# Patient Record
Sex: Female | Born: 1965 | Race: Black or African American | Hispanic: No | Marital: Single
Health system: Southern US, Community
[De-identification: ages and names within clinical notes are randomized; demographics above are authoritative.]

## PROBLEM LIST (undated history)

## (undated) DIAGNOSIS — E559 Vitamin D deficiency, unspecified: Secondary | ICD-10-CM

## (undated) DIAGNOSIS — I219 Acute myocardial infarction, unspecified: Secondary | ICD-10-CM

## (undated) DIAGNOSIS — J449 Chronic obstructive pulmonary disease, unspecified: Secondary | ICD-10-CM

## (undated) DIAGNOSIS — I252 Old myocardial infarction: Secondary | ICD-10-CM

## (undated) DIAGNOSIS — I10 Essential (primary) hypertension: Secondary | ICD-10-CM

## (undated) DIAGNOSIS — M170 Bilateral primary osteoarthritis of knee: Secondary | ICD-10-CM

## (undated) DIAGNOSIS — I509 Heart failure, unspecified: Secondary | ICD-10-CM

## (undated) DIAGNOSIS — R7303 Prediabetes: Secondary | ICD-10-CM

## (undated) DIAGNOSIS — G8929 Other chronic pain: Secondary | ICD-10-CM

## (undated) HISTORY — DX: Bilateral primary osteoarthritis of knee: M17.0

## (undated) HISTORY — PX: TUBAL LIGATION: SHX77

## (undated) HISTORY — DX: Old myocardial infarction: I25.2

## (undated) HISTORY — DX: Chronic obstructive pulmonary disease, unspecified: J44.9

## (undated) HISTORY — DX: Heart failure, unspecified: I50.9

## (undated) HISTORY — DX: Essential (primary) hypertension: I10

## (undated) HISTORY — PX: ABDOMINAL HYSTERECTOMY: SHX81

## (undated) HISTORY — DX: Prediabetes: R73.03

## (undated) HISTORY — DX: Acute myocardial infarction, unspecified: I21.9

## (undated) HISTORY — DX: Vitamin D deficiency, unspecified: E55.9

## (undated) HISTORY — DX: Other chronic pain: G89.29

---

## 2014-10-06 ENCOUNTER — Encounter (HOSPITAL_COMMUNITY): Payer: Self-pay | Admitting: Emergency Medicine

## 2014-10-06 ENCOUNTER — Emergency Department (HOSPITAL_COMMUNITY)
Admission: EM | Admit: 2014-10-06 | Discharge: 2014-10-06 | Disposition: A | Discharge status: Home / Self Care | Payer: Self-pay | Attending: Emergency Medicine | Admitting: Emergency Medicine

## 2014-10-06 ENCOUNTER — Emergency Department (HOSPITAL_COMMUNITY): Payer: Self-pay

## 2014-10-06 DIAGNOSIS — Z720 Tobacco use: Secondary | ICD-10-CM | POA: Insufficient documentation

## 2014-10-06 DIAGNOSIS — J209 Acute bronchitis, unspecified: Secondary | ICD-10-CM | POA: Insufficient documentation

## 2014-10-06 DIAGNOSIS — Z79899 Other long term (current) drug therapy: Secondary | ICD-10-CM | POA: Insufficient documentation

## 2014-10-06 LAB — BASIC METABOLIC PANEL
ANION GAP: 9 (ref 5–15)
BUN: 8 mg/dL (ref 6–23)
CALCIUM: 8.9 mg/dL (ref 8.4–10.5)
CO2: 29 mmol/L (ref 19–32)
Chloride: 98 mmol/L (ref 96–112)
Creatinine, Ser: 0.81 mg/dL (ref 0.50–1.10)
GFR calc non Af Amer: 85 mL/min — ABNORMAL LOW (ref >90)
Glucose, Bld: 89 mg/dL (ref 70–99)
POTASSIUM: 3.6 mmol/L (ref 3.5–5.1)
Sodium: 136 mmol/L (ref 135–145)

## 2014-10-06 LAB — CBC
HCT: 44.1 % (ref 36.0–46.0)
Hemoglobin: 15 g/dL (ref 12.0–15.0)
MCH: 32.1 pg (ref 26.0–34.0)
MCHC: 34 g/dL (ref 30.0–36.0)
MCV: 94.4 fL (ref 78.0–100.0)
Platelets: 293 10*3/uL (ref 150–400)
RBC: 4.67 MIL/uL (ref 3.87–5.11)
RDW: 14.7 % (ref 11.5–15.5)
WBC: 5.3 10*3/uL (ref 4.0–10.5)

## 2014-10-06 MED ORDER — DEXAMETHASONE SODIUM PHOSPHATE 10 MG/ML IJ SOLN
6.0000 mg | Freq: Once | INTRAMUSCULAR | Status: AC
  Administered 2014-10-06: 6 mg via INTRAMUSCULAR
  Filled 2014-10-06: qty 1

## 2014-10-06 MED ORDER — ALBUTEROL SULFATE HFA 108 (90 BASE) MCG/ACT IN AERS
2.0000 | INHALATION_SPRAY | Freq: Once | RESPIRATORY_TRACT | Status: AC
  Administered 2014-10-06: 2 via RESPIRATORY_TRACT
  Filled 2014-10-06: qty 6.7

## 2014-10-06 MED ORDER — GUAIFENESIN 100 MG/5ML PO LIQD: 100.0000 mg | ORAL | Status: DC | PRN

## 2014-10-06 MED ORDER — ALBUTEROL SULFATE (2.5 MG/3ML) 0.083% IN NEBU
5.0000 mg | INHALATION_SOLUTION | Freq: Once | RESPIRATORY_TRACT | Status: AC
  Administered 2014-10-06: 5 mg via RESPIRATORY_TRACT
  Filled 2014-10-06: qty 6

## 2014-10-06 NOTE — Discharge Instructions (Signed)

## 2014-10-06 NOTE — ED Notes (Signed)
To ED via [facility] 12-- from work-- with c/o shortness of breath, increasing over past 2 days, wheezing on EMS arrival-- received Albuterol 5mg  nebulizer enroute. Pt in NSR with unifocal PVC's -- IV in left hand-- 20g per EMS

## 2014-10-06 NOTE — ED Provider Notes (Signed)
CSN: [id number]     Arrival date & time 10/06/14  1000 History   First MD Initiated Contact with Patient 10/06/14 1053     Chief Complaint  Patient presents with  . Shortness of Breath   Breanna Khan is a 49 y.o. female who is an everyday smoker who presents to the ED complaining of cold like symptoms for 2 days and increasing shortness of breath since yesterday. Patient reports that beginning 2 days ago she started having cold symptoms with cough, wheezing, and runny nose. She reports starting yesterday she began feeling more short of breath and having more wheezing. She reports today while at work she became increasingly short of breath and having wheezing. Patient called 911 and received albuterol treatment by EMS prior to arrival. She reports feeling improvement after her breathing treatment. Patient denies history of asthma or pneumonia. Patient is an everyday smoker. The patient has taken nothing for treatment today. The patient denies any pain currently. The patient denies fevers, chills, body aches, sick contacts, chest pain, palpitations, abdominal pain, nausea, vomiting, ear pain, eye pain or rashes.  (Consider location/radiation/quality/duration/timing/severity/associated sxs/prior Treatment) HPI  History reviewed. No pertinent past medical history. Past Surgical History  Procedure Laterality Date  . Abdominal hysterectomy    . Tubal ligation     No family history on file. History  Substance Use Topics  . Smoking status: Current Every Day Smoker    Types: Cigarettes  . Smokeless tobacco: Not on file  . Alcohol Use: Yes     Comment: socially   OB History    No data available     Review of Systems  Constitutional: Negative for fever and chills.  HENT: Positive for postnasal drip, rhinorrhea and sneezing. Negative for congestion, ear pain, sinus pressure, sore throat and trouble swallowing.   Eyes: Negative for pain and visual disturbance.  Respiratory: Positive for  cough, shortness of breath and wheezing.   Cardiovascular: Negative for chest pain and palpitations.  Gastrointestinal: Negative for nausea, vomiting, abdominal pain and diarrhea.  Genitourinary: Negative for dysuria, urgency, frequency and hematuria.  Musculoskeletal: Negative for back pain, arthralgias and neck pain.  Skin: Negative for rash and wound.  Neurological: Negative for weakness, light-headedness and headaches.      Allergies  Review of patient's allergies indicates no known allergies.  Home Medications   Prior to Admission medications   Medication Sig Start Date End Date Taking? Authorizing Provider  guaiFENesin (ROBITUSSIN) 100 MG/5ML liquid Take 5-10 mLs (100-200 mg total) by mouth every 4 (four) hours as needed for cough. 10/06/14   Everlene Farrier, PA-C  Pseudoeph-Doxylamine-DM-APAP (NYQUIL PO) Take 1 Dose by mouth as needed (cold symptoms).   Yes Historical Provider, MD  pseudoephedrine (SUDAFED) 30 MG tablet Take 30 mg by mouth every 4 (four) hours as needed for congestion.   Yes Historical Provider, MD   BP 125/76 mmHg  Pulse 88  Temp(Src) 97.9 F (36.6 C) (Oral)  Resp 24  Ht  (1.854 m)  SpO2 92% Physical Exam  Constitutional: She is oriented to person, place, and time. She appears well-developed and well-nourished. No distress.  Nontoxic appearing.  HENT:  Head: Normocephalic and atraumatic.  Right Ear: External ear normal.  Left Ear: External ear normal.  Nose: Nose normal.  Mouth/Throat: Oropharynx is clear and moist. No oropharyngeal exudate.  No oropharyngeal erythema, tonsillar hypertrophy or exudates. Bilateral tympanic membranes are pearly-gray without erythema or loss of landmarks.  Eyes: Conjunctivae are normal. Pupils are equal, round,  and reactive to light. Right eye exhibits no discharge. Left eye exhibits no discharge.  Neck: Normal range of motion. Neck supple. No JVD present. No tracheal deviation present.  Cardiovascular: Normal rate,  regular rhythm, normal heart sounds and intact distal pulses.  Exam reveals no gallop and no friction rub.   No murmur heard. Pulmonary/Chest: Effort normal. No respiratory distress. She has wheezes. She has no rales.  Lung sounds are slightly diminished bilaterally. Scattered wheezes noted bilaterally. Patient speaking in full sentences.  Abdominal: Soft. She exhibits no distension. There is no tenderness.  Musculoskeletal: She exhibits no edema.  No lower extremity edema or tenderness.  Lymphadenopathy:    She has no cervical adenopathy.  Neurological: She is alert and oriented to person, place, and time. Coordination normal.  Skin: Skin is warm and dry. No rash noted. She is not diaphoretic. No erythema. No pallor.  Psychiatric: She has a normal mood and affect. Her behavior is normal.  Nursing note and vitals reviewed.   ED Course  Procedures (including critical care time) Labs Review Labs Reviewed  BASIC METABOLIC PANEL - Abnormal; Notable for the following:    GFR calc non Af Amer 85 (*)    All other components within normal limits  CBC    Imaging Review Dg Chest 2 View (if Patient Has Fever And/or Copd)  10/06/2014   CLINICAL DATA:  Productive cough for the past 2 days. Left chest pain and shortness of breath today.  EXAM: CHEST  2 VIEW  COMPARISON:  None.  FINDINGS: Normal sized heart. Clear lungs. The lungs are mildly hyperexpanded with mild diffuse peribronchial thickening. Unremarkable bones.  IMPRESSION: No acute abnormality.  Mild changes of COPD and chronic bronchitis.   Electronically Signed   By: Beckie Salts M.D.   On: 10/06/2014 12:10     EKG Interpretation   Date/Time:  Sunday October 06 2014 10:11:42 EDT Ventricular Rate:  81 PR Interval:  154 QRS Duration: 85 QT Interval:  400 QTC Calculation: 464 R Axis:   85 Text Interpretation:  Sinus rhythm Multiple ventricular premature  complexes Right atrial enlargement Anterior infarct, old Nonspecific T   abnormalities, inferior leads No previous tracing Confirmed by BEATON  MD,  ROBERT ([id number]) on 10/06/2014 10:55:16 AM      Filed Vitals:   10/06/14 1230 10/06/14 1245 10/06/14 1300 10/06/14 1356  BP: 136/75 143/58 132/66 125/76  Pulse: 80 79 88 88  Temp:      TempSrc:      Resp: 21 17 16 24   Height:      SpO2: 91% 89% 91% 92%     MDM   Meds given in ED:  Medications  albuterol (PROVENTIL) (2.5 MG/3ML) 0.083% nebulizer solution 5 mg (5 mg Nebulization Given 10/06/14 1020)  albuterol (PROVENTIL) (2.5 MG/3ML) 0.083% nebulizer solution 5 mg (5 mg Nebulization Given 10/06/14 1150)  dexamethasone (DECADRON) injection 6 mg (6 mg Intramuscular Given 10/06/14 1503)  albuterol (PROVENTIL HFA;VENTOLIN HFA) 108 (90 BASE) MCG/ACT inhaler 2 puff (2 puffs Inhalation Given 10/06/14 1459)    New Prescriptions   GUAIFENESIN (ROBITUSSIN) 100 MG/5ML LIQUID    Take 5-10 mLs (100-200 mg total) by mouth every 4 (four) hours as needed for cough.    Final diagnoses:  Acute bronchitis, unspecified organism   This  is a 49 y.o. female who is an everyday smoker who presents to the ED complaining of cold like symptoms for 2 days and increasing shortness of breath since yesterday. Patient reports that  beginning 2 days ago she started having cold symptoms with cough, wheezing, and runny nose. She reports starting yesterday she began feeling more short of breath and having more wheezing. She received 5 mg albuterol by EMS prior to arrival. On exam the patient is afebrile and nontoxic appearing. She is not tachypneic or hypoxic. Patient's lungs are diminished bilaterally with scattered wheezes bilaterally. She reports feeling better but still slightly short of breath. Will repeat albuterol treatment and reassess. Chest x-ray and blood work ordered. Chest x-ray shows no acute abnormality but mild changes of COPD and chronic bronchitis. BMP and CBC are unremarkable.  1345: At a revaluation the patient is found  sleeping in her room on room air with an oxygen saturation of 91%. I woke the patient and she reports feeling much better and is no longer short of breath. She denies any chest tightness. Once the patient woke her oxygen saturation is 95% on room air. Will ambulate the patient on pulse ox to determine her oxygen saturation.  The patient ambulated in the hallway without difficulty with the lowest oxygen saturation at 92%. Another reevaluation the patient still reports feeling better and no shortness of breath. She feels ready for discharge. Patient provided with Decadron and albuterol inhaler in the ED.  Advised patient to follow up at the wellness center this week. Strict return precautions provided. I advised the patient to follow-up with their primary care provider this week. I advised the patient to return to the emergency department with new or worsening symptoms or new concerns. The patient verbalized understanding and agreement with plan.   This patient was discussed with Dr. Radford Pax who agrees with assessment and plan.     Everlene Farrier, PA-C 10/06/14 1528  Nelva Nay, MD 10/09/14 1017

## 2014-10-06 NOTE — ED Notes (Signed)
Ambulated in hallway 02 @ 92% HR 96

## 2014-11-27 ENCOUNTER — Other Ambulatory Visit (HOSPITAL_COMMUNITY): Payer: Self-pay

## 2014-11-27 ENCOUNTER — Encounter (HOSPITAL_COMMUNITY)
Admission: EM | Disposition: A | Discharge status: Home / Self Care | Payer: Self-pay | Source: Home / Self Care | Attending: Cardiology

## 2014-11-27 ENCOUNTER — Inpatient Hospital Stay (INDEPENDENT_AMBULATORY_CARE_PROVIDER_SITE_OTHER): Payer: Self-pay

## 2014-11-27 ENCOUNTER — Inpatient Hospital Stay (HOSPITAL_COMMUNITY)
Admission: EM | Admit: 2014-11-27 | Discharge: 2014-11-30 | DRG: 251 | Disposition: A | Discharge status: Home / Self Care | Payer: Self-pay | Attending: Cardiology | Admitting: Cardiology

## 2014-11-27 ENCOUNTER — Emergency Department (HOSPITAL_COMMUNITY): Payer: Self-pay

## 2014-11-27 ENCOUNTER — Encounter (HOSPITAL_COMMUNITY): Payer: Self-pay | Admitting: Emergency Medicine

## 2014-11-27 DIAGNOSIS — Z23 Encounter for immunization: Secondary | ICD-10-CM

## 2014-11-27 DIAGNOSIS — F17210 Nicotine dependence, cigarettes, uncomplicated: Secondary | ICD-10-CM

## 2014-11-27 DIAGNOSIS — I249 Acute ischemic heart disease, unspecified: Secondary | ICD-10-CM

## 2014-11-27 DIAGNOSIS — I200 Unstable angina: Secondary | ICD-10-CM

## 2014-11-27 DIAGNOSIS — I493 Ventricular premature depolarization: Secondary | ICD-10-CM

## 2014-11-27 DIAGNOSIS — I5021 Acute systolic (congestive) heart failure: Secondary | ICD-10-CM

## 2014-11-27 DIAGNOSIS — I219 Acute myocardial infarction, unspecified: Secondary | ICD-10-CM

## 2014-11-27 DIAGNOSIS — I214 Non-ST elevation (NSTEMI) myocardial infarction: Principal | ICD-10-CM | POA: Diagnosis present

## 2014-11-27 DIAGNOSIS — I509 Heart failure, unspecified: Secondary | ICD-10-CM

## 2014-11-27 DIAGNOSIS — I10 Essential (primary) hypertension: Secondary | ICD-10-CM | POA: Diagnosis present

## 2014-11-27 DIAGNOSIS — Z955 Presence of coronary angioplasty implant and graft: Secondary | ICD-10-CM

## 2014-11-27 DIAGNOSIS — Z720 Tobacco use: Secondary | ICD-10-CM

## 2014-11-27 DIAGNOSIS — I25110 Atherosclerotic heart disease of native coronary artery with unstable angina pectoris: Secondary | ICD-10-CM

## 2014-11-27 DIAGNOSIS — E876 Hypokalemia: Secondary | ICD-10-CM | POA: Diagnosis present

## 2014-11-27 DIAGNOSIS — I2510 Atherosclerotic heart disease of native coronary artery without angina pectoris: Secondary | ICD-10-CM

## 2014-11-27 HISTORY — DX: Acute ischemic heart disease, unspecified: I24.9

## 2014-11-27 HISTORY — DX: Heart failure, unspecified: I50.9

## 2014-11-27 HISTORY — DX: Ventricular premature depolarization: I49.3

## 2014-11-27 HISTORY — DX: Acute systolic (congestive) heart failure: I50.21

## 2014-11-27 HISTORY — PX: CARDIAC CATHETERIZATION: SHX172

## 2014-11-27 HISTORY — DX: Acute myocardial infarction, unspecified: I21.9

## 2014-11-27 HISTORY — DX: Nicotine dependence, cigarettes, uncomplicated: F17.210

## 2014-11-27 LAB — CBC
HEMATOCRIT: 46.5 % — AB (ref 36.0–46.0)
Hemoglobin: 16.1 g/dL — ABNORMAL HIGH (ref 12.0–15.0)
MCH: 32.7 pg (ref 26.0–34.0)
MCHC: 34.6 g/dL (ref 30.0–36.0)
MCV: 94.3 fL (ref 78.0–100.0)
Platelets: 310 10*3/uL (ref 150–400)
RBC: 4.93 MIL/uL (ref 3.87–5.11)
RDW: 14 % (ref 11.5–15.5)
WBC: 6.9 10*3/uL (ref 4.0–10.5)

## 2014-11-27 LAB — COMPREHENSIVE METABOLIC PANEL
ALBUMIN: 3.8 g/dL (ref 3.5–5.0)
ALT: 26 U/L (ref 14–54)
AST: 44 U/L — AB (ref 15–41)
Alkaline Phosphatase: 54 U/L (ref 38–126)
Anion gap: 10 (ref 5–15)
BILIRUBIN TOTAL: 0.6 mg/dL (ref 0.3–1.2)
BUN: 10 mg/dL (ref 6–20)
CO2: 25 mmol/L (ref 22–32)
Calcium: 8.8 mg/dL — ABNORMAL LOW (ref 8.9–10.3)
Chloride: 100 mmol/L — ABNORMAL LOW (ref 101–111)
Creatinine, Ser: 1.01 mg/dL — ABNORMAL HIGH (ref 0.44–1.00)
GFR calc Af Amer: >60 mL/min (ref >60)
GFR calc non Af Amer: >60 mL/min (ref >60)
Glucose, Bld: 218 mg/dL — ABNORMAL HIGH (ref 65–99)
Potassium: 3.6 mmol/L (ref 3.5–5.1)
Sodium: 135 mmol/L (ref 135–145)
Total Protein: 7.2 g/dL (ref 6.5–8.1)

## 2014-11-27 LAB — POCT ACTIVATED CLOTTING TIME: ACTIVATED CLOTTING TIME: 454 s

## 2014-11-27 LAB — MRSA PCR SCREENING: MRSA BY PCR: NEGATIVE

## 2014-11-27 LAB — GLUCOSE, CAPILLARY: Glucose-Capillary: 109 mg/dL — ABNORMAL HIGH (ref 65–99)

## 2014-11-27 LAB — BRAIN NATRIURETIC PEPTIDE: B NATRIURETIC PEPTIDE 5: 495 pg/mL — AB (ref 0.0–100.0)

## 2014-11-27 LAB — I-STAT TROPONIN, ED: TROPONIN I, POC: 0.01 ng/mL (ref 0.00–0.08)

## 2014-11-27 LAB — TROPONIN I
TROPONIN I: 0.34 ng/mL — AB (ref <0.031)
TROPONIN I: 0.37 ng/mL — AB (ref <0.031)

## 2014-11-27 LAB — PROTIME-INR
INR: 1.08 (ref 0.00–1.49)
Prothrombin Time: 14.2 seconds (ref 11.6–15.2)

## 2014-11-27 LAB — APTT: aPTT: 29 seconds (ref 24–37)

## 2014-11-27 SURGERY — LEFT HEART CATH AND CORONARY ANGIOGRAPHY
Anesthesia: LOCAL

## 2014-11-27 MED ORDER — LISINOPRIL 5 MG PO TABS
5.0000 mg | ORAL_TABLET | Freq: Every day | ORAL | Status: DC
  Administered 2014-11-27 – 2014-11-30 (×4): 5 mg via ORAL
  Filled 2014-11-27 (×4): qty 1

## 2014-11-27 MED ORDER — AMIODARONE HCL 150 MG/3ML IV SOLN
INTRAVENOUS | Status: AC
  Filled 2014-11-27: qty 3

## 2014-11-27 MED ORDER — OXYCODONE-ACETAMINOPHEN 5-325 MG PO TABS: 1.0000 | ORAL_TABLET | ORAL | Status: DC | PRN

## 2014-11-27 MED ORDER — METOPROLOL TARTRATE 1 MG/ML IV SOLN
INTRAVENOUS | Status: AC
  Filled 2014-11-27: qty 5

## 2014-11-27 MED ORDER — VERAPAMIL HCL 2.5 MG/ML IV SOLN
INTRAVENOUS | Status: AC
  Filled 2014-11-27: qty 2

## 2014-11-27 MED ORDER — MIDAZOLAM HCL 2 MG/2ML IJ SOLN
INTRAMUSCULAR | Status: DC | PRN
  Administered 2014-11-27 (×2): 1 mg via INTRAVENOUS

## 2014-11-27 MED ORDER — SODIUM CHLORIDE 0.9 % IV SOLN
0.2500 mg/kg/h | INTRAVENOUS | Status: DC
  Filled 2014-11-27: qty 250

## 2014-11-27 MED ORDER — BIVALIRUDIN 250 MG IV SOLR
INTRAVENOUS | Status: AC
  Filled 2014-11-27: qty 250

## 2014-11-27 MED ORDER — LIDOCAINE HCL (PF) 1 % IJ SOLN
INTRAMUSCULAR | Status: DC | PRN
  Administered 2014-11-27: 5 mL

## 2014-11-27 MED ORDER — NITROGLYCERIN 1 MG/10 ML FOR IR/CATH LAB
INTRA_ARTERIAL | Status: AC
  Filled 2014-11-27: qty 10

## 2014-11-27 MED ORDER — ASPIRIN 81 MG PO CHEW
81.0000 mg | CHEWABLE_TABLET | Freq: Every day | ORAL | Status: DC
  Administered 2014-11-28 – 2014-11-30 (×3): 81 mg via ORAL
  Filled 2014-11-27 (×3): qty 1

## 2014-11-27 MED ORDER — BIVALIRUDIN BOLUS VIA INFUSION - CUPID
INTRAVENOUS | Status: DC | PRN
  Administered 2014-11-27: 66.375 mg via INTRAVENOUS

## 2014-11-27 MED ORDER — SODIUM CHLORIDE 0.9 % IJ SOLN
INTRAMUSCULAR | Status: DC | PRN
  Administered 2014-11-27: 12:00:00 via INTRA_ARTERIAL

## 2014-11-27 MED ORDER — IOHEXOL 350 MG/ML SOLN
INTRAVENOUS | Status: DC | PRN
  Administered 2014-11-27: 200 mL via INTRACARDIAC

## 2014-11-27 MED ORDER — LIDOCAINE HCL (PF) 1 % IJ SOLN
INTRAMUSCULAR | Status: AC
  Filled 2014-11-27: qty 30

## 2014-11-27 MED ORDER — SODIUM CHLORIDE 0.9 % IJ SOLN
3.0000 mL | Freq: Two times a day (BID) | INTRAMUSCULAR | Status: DC
  Administered 2014-11-27 – 2014-11-28 (×3): 3 mL via INTRAVENOUS
  Administered 2014-11-29: 10 mL via INTRAVENOUS
  Administered 2014-11-29 – 2014-11-30 (×2): 3 mL via INTRAVENOUS

## 2014-11-27 MED ORDER — AMIODARONE HCL 150 MG/3ML IV SOLN
INTRAVENOUS | Status: DC | PRN
  Administered 2014-11-27: 150 mg via INTRAVENOUS

## 2014-11-27 MED ORDER — NITROGLYCERIN IN D5W 200-5 MCG/ML-% IV SOLN
2.0000 ug/min | INTRAVENOUS | Status: AC
  Administered 2014-11-27: 20 ug/min via INTRAVENOUS

## 2014-11-27 MED ORDER — SODIUM CHLORIDE 0.9 % IJ SOLN: 3.0000 mL | INTRAMUSCULAR | Status: DC | PRN

## 2014-11-27 MED ORDER — NITROGLYCERIN IN D5W 200-5 MCG/ML-% IV SOLN
INTRAVENOUS | Status: AC
  Filled 2014-11-27: qty 250

## 2014-11-27 MED ORDER — NITROGLYCERIN IN D5W 200-5 MCG/ML-% IV SOLN
INTRAVENOUS | Status: DC | PRN
  Administered 2014-11-27: 20 ug/min via INTRAVENOUS

## 2014-11-27 MED ORDER — SODIUM CHLORIDE 0.9 % IV SOLN
250.0000 mg | INTRAVENOUS | Status: DC | PRN
  Administered 2014-11-27: 1.75 mg/kg/h via INTRAVENOUS

## 2014-11-27 MED ORDER — CLOPIDOGREL BISULFATE 300 MG PO TABS
ORAL_TABLET | ORAL | Status: DC | PRN
  Administered 2014-11-27: 600 mg via ORAL

## 2014-11-27 MED ORDER — ACETAMINOPHEN 325 MG PO TABS: 650.0000 mg | ORAL_TABLET | ORAL | Status: DC | PRN

## 2014-11-27 MED ORDER — HEPARIN SODIUM (PORCINE) 1000 UNIT/ML IJ SOLN
INTRAMUSCULAR | Status: AC
  Filled 2014-11-27: qty 1

## 2014-11-27 MED ORDER — PNEUMOCOCCAL VAC POLYVALENT 25 MCG/0.5ML IJ INJ
0.5000 mL | INJECTION | INTRAMUSCULAR | Status: AC
  Administered 2014-11-28: 0.5 mL via INTRAMUSCULAR
  Filled 2014-11-27: qty 0.5

## 2014-11-27 MED ORDER — HEPARIN SODIUM (PORCINE) 5000 UNIT/ML IJ SOLN
4000.0000 [IU] | INTRAMUSCULAR | Status: AC
  Administered 2014-11-27: 09:00:00 via INTRAVENOUS
  Filled 2014-11-27: qty 1

## 2014-11-27 MED ORDER — SODIUM CHLORIDE 0.9 % IV SOLN: INTRAVENOUS | Status: DC

## 2014-11-27 MED ORDER — HEPARIN SODIUM (PORCINE) 1000 UNIT/ML IJ SOLN
INTRAMUSCULAR | Status: DC | PRN
  Administered 2014-11-27: 4500 [IU] via INTRAVENOUS

## 2014-11-27 MED ORDER — SODIUM CHLORIDE 0.9 % IV SOLN: 0.2500 mg/kg/h | INTRAVENOUS | Status: DC

## 2014-11-27 MED ORDER — SODIUM CHLORIDE 0.9 % IV SOLN: 0.2500 mg/kg/h | INTRAVENOUS | Status: AC

## 2014-11-27 MED ORDER — FENTANYL CITRATE (PF) 100 MCG/2ML IJ SOLN
INTRAMUSCULAR | Status: DC | PRN
  Administered 2014-11-27: 50 ug via INTRAVENOUS

## 2014-11-27 MED ORDER — CLOPIDOGREL BISULFATE 300 MG PO TABS
ORAL_TABLET | ORAL | Status: AC
  Filled 2014-11-27: qty 1

## 2014-11-27 MED ORDER — ENALAPRILAT 1.25 MG/ML IV SOLN: 1.2500 mg | Freq: Once | INTRAVENOUS | Status: DC

## 2014-11-27 MED ORDER — SODIUM CHLORIDE 0.9 % WEIGHT BASED INFUSION
3.0000 mL/kg/h | INTRAVENOUS | Status: AC
Start: 2014-11-27 — End: 2014-11-27

## 2014-11-27 MED ORDER — SODIUM CHLORIDE 0.9 % IV SOLN: 250.0000 mL | INTRAVENOUS | Status: DC | PRN

## 2014-11-27 MED ORDER — MIDAZOLAM HCL 2 MG/2ML IJ SOLN
INTRAMUSCULAR | Status: AC
  Filled 2014-11-27: qty 2

## 2014-11-27 MED ORDER — SODIUM CHLORIDE 0.9 % IJ SOLN: 3.0000 mL | Freq: Two times a day (BID) | INTRAMUSCULAR | Status: DC

## 2014-11-27 MED ORDER — HEPARIN (PORCINE) IN NACL 2-0.9 UNIT/ML-% IJ SOLN
INTRAMUSCULAR | Status: AC
  Filled 2014-11-27: qty 1000

## 2014-11-27 MED ORDER — CLOPIDOGREL BISULFATE 75 MG PO TABS
75.0000 mg | ORAL_TABLET | Freq: Every day | ORAL | Status: DC
  Administered 2014-11-28 – 2014-11-30 (×3): 75 mg via ORAL
  Filled 2014-11-27 (×3): qty 1

## 2014-11-27 MED ORDER — ONDANSETRON HCL 4 MG/2ML IJ SOLN: 4.0000 mg | Freq: Four times a day (QID) | INTRAMUSCULAR | Status: DC | PRN

## 2014-11-27 MED ORDER — METOPROLOL TARTRATE 1 MG/ML IV SOLN
5.0000 mg | Freq: Once | INTRAVENOUS | Status: AC
  Administered 2014-11-27: 5 mg via INTRAVENOUS

## 2014-11-27 MED ORDER — FENTANYL CITRATE (PF) 100 MCG/2ML IJ SOLN
INTRAMUSCULAR | Status: AC
  Filled 2014-11-27: qty 2

## 2014-11-27 MED ORDER — NITROGLYCERIN 1 MG/10 ML FOR IR/CATH LAB
INTRA_ARTERIAL | Status: DC | PRN
  Administered 2014-11-27: 200 ug via INTRACORONARY

## 2014-11-27 MED ORDER — ASPIRIN 81 MG PO CHEW: 81.0000 mg | CHEWABLE_TABLET | ORAL | Status: DC

## 2014-11-27 MED ORDER — ATORVASTATIN CALCIUM 80 MG PO TABS
80.0000 mg | ORAL_TABLET | Freq: Every day | ORAL | Status: DC
  Administered 2014-11-27 – 2014-11-29 (×3): 80 mg via ORAL
  Filled 2014-11-27 (×4): qty 1

## 2014-11-27 MED ORDER — CARVEDILOL 3.125 MG PO TABS
3.1250 mg | ORAL_TABLET | Freq: Two times a day (BID) | ORAL | Status: DC
  Administered 2014-11-27 – 2014-11-28 (×2): 3.125 mg via ORAL
  Filled 2014-11-27 (×4): qty 1

## 2014-11-27 SURGICAL SUPPLY — 18 items
BALLN ANGIOSCULPT RX 3.0X10 (BALLOONS) ×2
BALLN EUPHORA RX 2.5X12 (BALLOONS) ×2
BALLOON ANGIOSCULPT RX 3.0X10 (BALLOONS) ×1 IMPLANT
BALLOON EUPHORA RX 2.5X12 (BALLOONS) ×1 IMPLANT
CATH INFINITI 5 FR JL3.5 (CATHETERS) ×2 IMPLANT
CATH INFINITI 5FR ANG PIGTAIL (CATHETERS) ×2 IMPLANT
CATH INFINITI JR4 5F (CATHETERS) ×2 IMPLANT
CATH VISTA GUIDE 6FR XB3.5 (CATHETERS) ×2 IMPLANT
DEVICE RAD COMP TR BAND LRG (VASCULAR PRODUCTS) ×2 IMPLANT
GLIDESHEATH SLEND A-KIT 6F 22G (SHEATH) ×2 IMPLANT
KIT ENCORE 26 ADVANTAGE (KITS) ×2 IMPLANT
KIT HEART LEFT (KITS) ×2 IMPLANT
PACK CARDIAC CATHETERIZATION (CUSTOM PROCEDURE TRAY) ×2 IMPLANT
TRANSDUCER W/STOPCOCK (MISCELLANEOUS) ×2 IMPLANT
TUBING CIL FLEX 10 FLL-RA (TUBING) ×2 IMPLANT
WIRE ASAHI PROWATER 180CM (WIRE) ×2 IMPLANT
WIRE HI TORQ BMW 190CM (WIRE) ×2 IMPLANT
WIRE SAFE-T 1.5MM-J .035X260CM (WIRE) ×2 IMPLANT

## 2014-11-27 NOTE — H&P (Signed)
CARDIOLOGY CONSULTATION NOTE.  NAME:  Breanna Khan   MRN: [id number] DOB:  [birth year]   ADMIT DATE: 11/27/2014  Reason for Consult: Acute coronary syndrome  Requesting Physician: Dr. Fayrene Fearing - EDP  Primary Cardiologist: New  HPI:  This is a 49 y.o. female with a past medical history significant for 20-pack-year smoking history but otherwise no recorded history of hypertension, hyperlipidemia or diabetes. She is not aware of any family history of any cardiac risk factors were CAD. She was in her usual state of health until roughly 3 days ago when she started having intermittent episodes of 5-10 minutes of central substernal chest pressure with exertion that over the last 3 days as happened with less exertion.  Today while walking across street to the bus stop to get to work, she had severe 78 out of 10 substernal crushing chest pain with some nausea and diaphoresis that resolved after roughly 10 minutes. 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 She was profoundly diaphoretic and nauseated and dizzy. She felt as though she may pass out. She felt rapid palpitations. Upon EMS arrival she was found to be profoundly hypertensive with blood pressures of 220/110 with frequent PVCs and what appeared to be lateral ST depressions.  Telemetry strips from EMS showed very frequent PVCs in mostly bigeminy pattern but also with couplets and triplets of PVCs. She was given sublingual nitroglycerin by EMS 2 along with IV morphine. 324 mg oral aspirin was administered. Upon arrival to [facility] ER she was relatively pain-free and blood pressures were down to the 150s over 100s. She was still having bigeminy PVCs. The normal QRS complexes in the lateral cortical leads show likely LVH with flat ST segments (as seen by EKG from April 2016) however the ST segments were depressed at least 1-2 mm in  leads V4 through V6.  On my evaluation in the emergency room the patient appears calm and is deathly pain-free no longer having dyspnea or nausea. Antecedent Cardiovascular ROS prior to last 3 days: no chest pain or dyspnea on exertion negative for - edema, irregular heartbeat, loss of consciousness, murmur, orthopnea, palpitations, paroxysmal nocturnal dyspnea, rapid heart rate, shortness of breath or TIA/amaurosis fugax, syncope/near-syncope   PMHx:  CARDIAC HISTORY: None Prior EKG from April 2016: Sinus rhythm, heart rate 81 with frequent PVCs. Poor R-wave progression precordial leads (read as anterior infarct, old) with likely LVH in lateral leads with flat ST segments.  History reviewed. No pertinent past medical history. Past Surgical History  Procedure Laterality Date  . Abdominal hysterectomy    . Tubal ligation      FAMHx: History reviewed. No pertinent family history. the patient is not aware of any family history of diabetes, hypertension, hyperlipidemia or coronary disease.  SOCHx:  reports that she has been smoking Cigarettes.  She has a 10 pack-year smoking history. She does not have any smokeless tobacco history on file. She reports that she drinks alcohol. She reports that she does not use illicit drugs.  ALLERGIES: No Known Allergies  HOME MEDICATIONS: Not on any standing meds.  HOSPITAL MEDICATIONS: - Given 4000 Units IV Heparin EMS - NTG SL x 2, IV Morphine ~2mg , ASA 324 mg  Review of Systems  Constitutional: Negative for fever, chills and malaise/fatigue.  HENT: Negative for nosebleeds.   Eyes: Negative for blurred vision.  Respiratory: Negative for shortness of  breath.   Cardiovascular: Negative for palpitations.  Gastrointestinal: Negative for blood in stool and melena.  Neurological: Negative for dizziness and headaches.  All other systems reviewed and are negative.   VITALS: Blood pressure 143/103, temperature 97.4 F (36.3 C), temperature source  Oral, resp. rate 18, height  (1.88 m), weight 88.451 kg (195 lb), SpO2 100 %.  PHYSICAL EXAM: General appearance: alert, cooperative, appears stated age, mild distress and Very anxious. Otherwise healthy appearing HEENT: [place]/AT, EOMI, MMM, anicteric sclera Neck: no adenopathy, no carotid bruit, no JVD and supple, symmetrical, trachea midline Lungs: clear to auscultation bilaterally, normal percussion bilaterally and Nonlabored, good air movement Heart: RRR with frequent ectopy, no obvious M/R/G. Non-displaced PMI Abdomen: soft, non-tender; bowel sounds normal; no masses,  no organomegaly Extremities: extremities normal, atraumatic, no cyanosis or edema Pulses: 2+ and symmetric Skin: Skin color, texture, turgor normal. No rashes or lesions Neurologic: Mental status: Alert, oriented, thought content appropriate, affect: normal Cranial nerves: normal  LABS: Results for orders placed or performed during the hospital encounter of 11/27/14 (from the past 24 hour(s))  APTT     Status: None   Collection Time: 11/27/14  9:14 AM  Result Value Ref Range   aPTT 29 24 - 37 seconds  CBC     Status: Abnormal   Collection Time: 11/27/14  9:14 AM  Result Value Ref Range   WBC 6.9 4.0 - 10.5 K/uL   RBC 4.93 3.87 - 5.11 MIL/uL   Hemoglobin 16.1 (H) 12.0 - 15.0 g/dL   HCT 16.1 (H) 09.6 - 04.5 %   MCV 94.3 78.0 - 100.0 fL   MCH 32.7 26.0 - 34.0 pg   MCHC 34.6 30.0 - 36.0 g/dL   RDW 40.9 81.1 - 91.4 %   Platelets 310 150 - 400 K/uL  Comprehensive metabolic panel     Status: Abnormal   Collection Time: 11/27/14  9:14 AM  Result Value Ref Range   Sodium 135 135 - 145 mmol/L   Potassium 3.6 3.5 - 5.1 mmol/L   Chloride 100 (L) 101 - 111 mmol/L   CO2 25 22 - 32 mmol/L   Glucose, Bld 218 (H) 65 - 99 mg/dL   BUN 10 6 - 20 mg/dL   Creatinine, Ser 7.82 (H) 0.44 - 1.00 mg/dL   Calcium 8.8 (L) 8.9 - 10.3 mg/dL   Total Protein 7.2 6.5 - 8.1 g/dL   Albumin 3.8 3.5 - 5.0 g/dL   AST 44 (H) 15 - 41 U/L     ALT 26 14 - 54 U/L   Alkaline Phosphatase 54 38 - 126 U/L   Total Bilirubin 0.6 0.3 - 1.2 mg/dL   GFR calc non Af Amer >60 >60 mL/min   GFR calc Af Amer >60 >60 mL/min   Anion gap 10 5 - 15  Protime-INR     Status: None   Collection Time: 11/27/14  9:14 AM  Result Value Ref Range   Prothrombin Time 14.2 11.6 - 15.2 seconds   INR 1.08 0.00 - 1.49  Brain natriuretic peptide     Status: Abnormal   Collection Time: 11/27/14  9:14 AM  Result Value Ref Range   B Natriuretic Peptide 495.0 (H) 0.0 - 100.0 pg/mL  I-Stat Troponin, ED (not at [facility], [facility])     Status: None   Collection Time: 11/27/14  9:26 AM  Result Value Ref Range   Troponin i, poc 0.01 0.00 - 0.08 ng/mL   Comment 3  IMAGING: Dg Chest Port 1 View  11/27/2014   CLINICAL DATA:  Intermittent chest pain for 3 days. Diaphoresis and shortness of breath.  EXAM: PORTABLE CHEST - 1 VIEW  COMPARISON:  10/06/2014  FINDINGS: Cardiac silhouette appears mildly enlarged, accentuated by portable AP technique and shallower inspiration than on the prior study. Mild peribronchial thickening is again seen. Patchy opacities in the left greater than right lung bases are new. No overt pulmonary edema, definite pleural effusion, or pneumothorax is identified. No acute osseous abnormality is identified.  IMPRESSION: Shallower inspiration with patchy bibasilar opacities, likely atelectasis.   Electronically Signed   By: Sebastian Ache   On: 11/27/2014 09:44    EKG: NSR - 99, PVCs in Vent Bigeminy patter, BiAtrial Abnormality, Poor anterior wave progression - Inferolateral ST depression  IMPRESSION: Principal Problem:   Acute coronary syndrome Active Problems:   Frequent PVCs - with pairs   Accelerated essential hypertension   Smokes 1/2 pack per day  49 year old woman with no real prior cardiac history presenting with signs and symptoms concerning for unstable angina/acute coronary syndrome with grossly abnormal EKG and frequent PVCs with  increasing pattern of bigeminy and pairs. This is definitely concerning for high risk acute coronary syndrome.  RECOMMENDATION:  Patient is still hypertensive and somewhat tachycardic with frequent PVCs in the ER, we will dose with IV Lopressor 5 mg  Plan for urgent cardiac catheterization plus minus PCI today.  She is currently nothing by mouth. Cath Lab as been contacted and she should be taken directly to cardiac catheterization lab.  I have discussed the patient with Dr. Katrinka Blazing, who will be performing the procedure.  Performing MD:  Verdis Prime, M.D Procedure:  Left Heart Catheterization with Coronary Angiography And Possible Percutaneous Coronary Intervention  The procedure with Risks/Benefits/Alternatives and Indications was reviewed with the patient and her fianc.  All questions were answered.    Risks / Complications include, but not limited to: Death, MI, CVA/TIA, VF/VT (with defibrillation), Bradycardia (need for temporary pacer placement), contrast induced nephropathy, bleeding / bruising / hematoma / pseudoaneurysm, vascular or coronary injury (with possible emergent CT or Vascular Surgery), adverse medication reactions, infection.  Additional risks involving the use of radiation with the possibility of radiation burns and cancer were explained in detail.  The patient (and fianc) voice understanding and agree to proceed.     She will need aggressive risk factor modification and evaluation pending results of cardiac catheterization.  Will start statin and beta blocker  Smoking cessation counseling    Time Spent Directly with Patient: 60 minutes  HARDING, Piedad Climes, M.D., M.S. Interventional Cardiologist   Pager # [phone]

## 2014-11-27 NOTE — Interval H&P Note (Signed)
Cath Lab Visit (complete for each Cath Lab visit)  Clinical Evaluation Leading to the Procedure:   ACS: Yes.    Non-ACS:    Anginal Classification: CCS IV  Anti-ischemic medical therapy: Maximal Therapy (2 or more classes of medications)  Non-Invasive Test Results: No non-invasive testing performed  Prior CABG: No previous CABG      History and Physical Interval Note:  11/27/2014 11:43 AM  Breanna Khan  has presented today for surgery, with the diagnosis of cp, EKG changes  The various methods of treatment have been discussed with the patient and family. After consideration of risks, benefits and other options for treatment, the patient has consented to  Procedure(s): Left Heart Cath and Coronary Angiography (N/A) as a surgical intervention .  The patient's history has been reviewed, patient examined, no change in status, stable for surgery.  I have reviewed the patient's chart and labs.  Questions were answered to the patient's satisfaction.     Lesleigh Noe

## 2014-11-27 NOTE — ED Notes (Signed)
Pt having intermittent CP past 3 days. Pt at work today having crushing chest pain. Pt diaphoretic, SOB. EMS gave 1 nitro, 324mg  ASA, 4 mg Morphine. Pain down to 5/10. Pt having depression and elevation in EKG. Possible stemi. BP 220/130. Pt now denies CP

## 2014-11-27 NOTE — ED Provider Notes (Signed)
CSN: [id number]     Arrival date & time 11/27/14  0908 History   First MD Initiated Contact with Patient 11/27/14 [phone]     Chief Complaint  Patient presents with  . Chest Pain      HPI  She presents for evaluation of chest pain. No history of known cardiac disease. Intimately for the last 3 days she's had pressure-like pain in her central chest mostly with exertion in less than half an hour at a time relieved with rest. No past similar episodes. She awakened this morning. She had walked to a bus stop to come to work. She had pain while ambulating better resolved sitting on the bus. 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 States it was severe and felt "crushing". She stopped and sat. EMS was summoned. Had PVCs and ST depressions. Given nitroglycerin 2 and IV morphine as well as 324 aspirin. Arrives here pain-free.  History of hypertension diabetes or any known family history of heart disease. She is a daily smoker.  History reviewed. No pertinent past medical history. Past Surgical History  Procedure Laterality Date  . Abdominal hysterectomy    . Tubal ligation     History reviewed. No pertinent family history. History  Substance Use Topics  . Smoking status: Current Every Day Smoker    Types: Cigarettes  . Smokeless tobacco: Not on file  . Alcohol Use: Yes     Comment: socially   OB History    No data available     Review of Systems  Constitutional: Positive for diaphoresis. Negative for fever, chills, appetite change and fatigue.  HENT: Negative for mouth sores, sore throat and trouble swallowing.   Eyes: Negative for visual disturbance.  Respiratory: Positive for shortness of breath. Negative for cough, chest tightness and wheezing.   Cardiovascular: Positive for chest pain.  Gastrointestinal: Negative for nausea, vomiting, abdominal pain, diarrhea and abdominal distention.  Endocrine: Negative for  polydipsia, polyphagia and polyuria.  Genitourinary: Negative for dysuria, frequency and hematuria.  Musculoskeletal: Negative for gait problem.  Skin: Negative for color change, pallor and rash.  Neurological: Negative for dizziness, syncope, light-headedness and headaches.  Hematological: Does not bruise/bleed easily.  Psychiatric/Behavioral: Negative for behavioral problems and confusion.      Allergies  Review of patient's allergies indicates no known allergies.  Home Medications   Prior to Admission medications   Medication Sig Start Date End Date Taking? Authorizing Provider  guaiFENesin (ROBITUSSIN) 100 MG/5ML liquid Take 5-10 mLs (100-200 mg total) by mouth every 4 (four) hours as needed for cough. 10/06/14   Everlene Farrier, PA-C  Pseudoeph-Doxylamine-DM-APAP (NYQUIL PO) Take 1 Dose by mouth as needed (cold symptoms).    Historical Provider, MD  pseudoephedrine (SUDAFED) 30 MG tablet Take 30 mg by mouth every 4 (four) hours as needed for congestion.    Historical Provider, MD   BP 153/104 mmHg  Temp(Src) 97.4 F (36.3 C) (Oral)  Resp 18  Ht 6\' 2"  (1.88 m)  Wt 195 lb (88.451 kg)  BMI 25.03 kg/m2  SpO2 98% Physical Exam  Constitutional: She is oriented to person, place, and time. She appears well-developed and well-nourished. No distress.  HENT:  Head: Normocephalic.  Eyes: Conjunctivae are normal. Pupils are equal, round, and reactive to light. No scleral icterus.  Neck: Normal range of motion. Neck supple. No thyromegaly present.  Cardiovascular: Normal rate and regular rhythm.  Exam reveals no  gallop and no friction rub.   No murmur heard. Pulmonary/Chest: Effort normal and breath sounds normal. No respiratory distress. She has no wheezes. She has no rales.  Abdominal: Soft. Bowel sounds are normal. She exhibits no distension. There is no tenderness. There is no rebound.  Musculoskeletal: Normal range of motion.  Neurological: She is alert and oriented to person,  place, and time.  Skin: Skin is warm and dry. No rash noted.  Psychiatric: She has a normal mood and affect. Her behavior is normal.  Appears appropriately anxious.    ED Course  Procedures (including critical care time) Labs Review Labs Reviewed  CBC - Abnormal; Notable for the following:    Hemoglobin 16.1 (*)    HCT 46.5 (*)    All other components within normal limits  APTT  PROTIME-INR  COMPREHENSIVE METABOLIC PANEL  BRAIN NATRIURETIC PEPTIDE  I-STAT TROPOININ, ED    Imaging Review Dg Chest Port 1 View  11/27/2014   CLINICAL DATA:  Intermittent chest pain for 3 days. Diaphoresis and shortness of breath.  EXAM: PORTABLE CHEST - 1 VIEW  COMPARISON:  10/06/2014  FINDINGS: Cardiac silhouette appears mildly enlarged, accentuated by portable AP technique and shallower inspiration than on the prior study. Mild peribronchial thickening is again seen. Patchy opacities in the left greater than right lung bases are new. No overt pulmonary edema, definite pleural effusion, or pneumothorax is identified. No acute osseous abnormality is identified.  IMPRESSION: Shallower inspiration with patchy bibasilar opacities, likely atelectasis.   Electronically Signed   By: Sebastian Ache   On: 11/27/2014 09:44     EKG Interpretation   Date/Time:  Wednesday November 27 2014 09:20:20 EDT Ventricular Rate:  114 PR Interval:  154 QRS Duration: 91 QT Interval:  358 QTC Calculation: 493 R Axis:   27 Text Interpretation:  Sinus tachycardia Ventricular bigeminy Inferior and  lateral ST depressin and T inversions LVH with secondary repolarization  abnormality Anterior infarct, old Confirmed by Fayrene Fearing  MD, Madelon Welsch ([id number]) on  11/27/2014 10:20:18 AM      MDM   Final diagnoses:  Unstable angina    CRITICAL CARE Performed by: Rolland Porter JOSEPH   Total critical care time:30 minutes.  Critical care time was exclusive of separately billable procedures and treating other patients.  Critical care was  necessary to treat or prevent imminent or life-threatening deterioration.  Critical care was time spent personally by me on the following activities: development of treatment plan with patient and/or surrogate as well as nursing, discussions with consultants, evaluation of patient's response to treatment, examination of patient, obtaining history from patient or surrogate, ordering and performing treatments and interventions, ordering and review of laboratory studies, ordering and review of radiographic studies, pulse oximetry and re-evaluation of patient's condition. Care   She presents pain free after being given nitroglycerin and morphine prehospital. Remains pain-free. Given heparin bolus and drip. Had been given aspirin prior to her arrival. EKG shows lateral and inferior ST depressions. Call placed to cardiology. Will be seen in consultation.    Rolland Porter, MD 11/27/14 1023

## 2014-11-27 NOTE — Progress Notes (Signed)
Echocardiogram 2D Echocardiogram has been performed.  Nolon Rod 11/27/2014, 5:09 PM

## 2014-11-28 ENCOUNTER — Encounter (HOSPITAL_COMMUNITY): Payer: Self-pay | Admitting: Interventional Cardiology

## 2014-11-28 DIAGNOSIS — I214 Non-ST elevation (NSTEMI) myocardial infarction: Principal | ICD-10-CM

## 2014-11-28 LAB — BASIC METABOLIC PANEL
ANION GAP: 12 (ref 5–15)
BUN: 8 mg/dL (ref 6–20)
CALCIUM: 7.9 mg/dL — AB (ref 8.9–10.3)
CO2: 21 mmol/L — AB (ref 22–32)
Chloride: 102 mmol/L (ref 101–111)
Creatinine, Ser: 0.59 mg/dL (ref 0.44–1.00)
GFR calc Af Amer: >60 mL/min (ref >60)
Glucose, Bld: 87 mg/dL (ref 65–99)
Potassium: 3.1 mmol/L — ABNORMAL LOW (ref 3.5–5.1)
SODIUM: 135 mmol/L (ref 135–145)

## 2014-11-28 LAB — CBC
HCT: 41.9 % (ref 36.0–46.0)
Hemoglobin: 14.5 g/dL (ref 12.0–15.0)
MCH: 32.4 pg (ref 26.0–34.0)
MCHC: 34.6 g/dL (ref 30.0–36.0)
MCV: 93.7 fL (ref 78.0–100.0)
Platelets: 268 10*3/uL (ref 150–400)
RBC: 4.47 MIL/uL (ref 3.87–5.11)
RDW: 14 % (ref 11.5–15.5)
WBC: 7.7 10*3/uL (ref 4.0–10.5)

## 2014-11-28 LAB — TROPONIN I: TROPONIN I: 0.35 ng/mL — AB (ref <0.031)

## 2014-11-28 MED ORDER — CARVEDILOL 6.25 MG PO TABS
6.2500 mg | ORAL_TABLET | Freq: Two times a day (BID) | ORAL | Status: DC
  Administered 2014-11-28 – 2014-11-29 (×2): 6.25 mg via ORAL
  Filled 2014-11-28 (×4): qty 1

## 2014-11-28 MED ORDER — CARVEDILOL 3.125 MG PO TABS
3.1250 mg | ORAL_TABLET | Freq: Once | ORAL | Status: AC
  Administered 2014-11-28: 3.125 mg via ORAL
  Filled 2014-11-28: qty 1

## 2014-11-28 MED FILL — Heparin Sodium (Porcine) 2 Unit/ML in Sodium Chloride 0.9%: INTRAMUSCULAR | Qty: 1000 | Status: AC

## 2014-11-28 NOTE — Progress Notes (Signed)
 Subjective:  No recurrent chest pain  Objective:   Vital Signs : Filed Vitals:   11/28/14 0700 11/28/14 0735 11/28/14 0800 11/28/14 0900  BP: 154/95 154/95 173/108 124/76  Pulse: 67 69 65   Temp:  97.8 F (36.6 C)    TempSrc:  Oral    Resp: 15 17 15 18  Height:      Weight:      SpO2: 99% 100% 100%     Intake/Output from previous day:  Intake/Output Summary (Last 24 hours) at 11/28/14 1000 Last data filed at 11/28/14 0900  Gross per 24 hour  Intake 993.38 ml  Output   1450 ml  Net -456.62 ml    I/O since admission: -456.6  Wt Readings from Last 3 Encounters:  11/27/14 198 lb 3.1 oz (89.9 kg)    Medications: . aspirin  81 mg Oral Daily  . atorvastatin  80 mg Oral q1800  . carvedilol  3.125 mg Oral BID WC  . clopidogrel  75 mg Oral Q breakfast  . lisinopril  5 mg Oral Daily  . pneumococcal 23 valent vaccine  0.5 mL Intramuscular Tomorrow-1000  . sodium chloride  3 mL Intravenous Q12H    . nitroGLYCERIN 10 mcg/min (11/27/14 1845)    Physical Exam:   General appearance: alert, cooperative and no distress Neck: no adenopathy, no carotid bruit, no JVD, supple, symmetrical, trachea midline and thyroid not enlarged, symmetric, no tenderness/mass/nodules Lungs: slightly decreased BS; no wheezing or rales Heart: regular rate and rhythm and 1/6 sem LSB Abdomen: soft, non-tender; bowel sounds normal; no masses,  no organomegaly Extremities: extremities normal, atraumatic, no cyanosis or edema and no edema, redness or tenderness in the calves or thighs Pulses: 2+ and symmetric; radial cath site stable Skin: Skin color, texture, turgor normal. No rashes or lesions Neurologic: Grossly normal   Rate: 85  Rhythm: normal sinus rhythm; PVC  ECG (independently read by me): NSR with PVC and inferior T wave inversion.  Lab Results:   Recent Labs  11/27/14 0914 11/28/14 0026  NA 135 135  K 3.6 3.1*  CL 100* 102  CO2 25 21*  GLUCOSE 218* 87  BUN 10 8    CREATININE 1.01* 0.59  CALCIUM 8.8* 7.9*    Hepatic Function Latest Ref Rng 11/27/2014  Total Protein 6.5 - 8.1 g/dL 7.2  Albumin 3.5 - 5.0 g/dL 3.8  AST 15 - 41 U/L 44(H)  ALT 14 - 54 U/L 26  Alk Phosphatase 38 - 126 U/L 54  Total Bilirubin 0.3 - 1.2 mg/dL 0.6     Recent Labs  11/27/14 0914 11/28/14 0026  WBC 6.9 7.7  HGB 16.1* 14.5  HCT 46.5* 41.9  MCV 94.3 93.7  PLT 310 268     Recent Labs  11/27/14 1707 11/27/14 1829 11/28/14 0026  TROPONINI 0.34* 0.37* 0.35*    No results found for: TSH No results for input(s): HGBA1C in the last 72 hours.   Recent Labs  11/27/14 0914  PROT 7.2  ALBUMIN 3.8  AST 44*  ALT 26  ALKPHOS 54  BILITOT 0.6    Recent Labs  11/27/14 0914  INR 1.08   BNP (last 3 results)  Recent Labs  11/27/14 0914  BNP 495.0*    ProBNP (last 3 results) No results for input(s): PROBNP in the last 8760 hours.   Lipid Panel  No results found for: CHOL, TRIG, HDL, CHOLHDL, VLDL, LDLCALC, LDLDIRECT     Imaging:  Dg Chest Port 1   View  11/27/2014   CLINICAL DATA:  Intermittent chest pain for 3 days. Diaphoresis and shortness of breath.  EXAM: PORTABLE CHEST - 1 VIEW  COMPARISON:  10/06/2014  FINDINGS: Cardiac silhouette appears mildly enlarged, accentuated by portable AP technique and shallower inspiration than on the prior study. Mild peribronchial thickening is again seen. Patchy opacities in the left greater than right lung bases are new. No overt pulmonary edema, definite pleural effusion, or pneumothorax is identified. No acute osseous abnormality is identified.  IMPRESSION: Shallower inspiration with patchy bibasilar opacities, likely atelectasis.   Electronically Signed   By: Allen  Grady   On: 11/27/2014 09:44      Assessment/Plan:   Principal Problem:   Acute coronary syndrome Active Problems:   Smokes 1/2 pack per day   Frequent PVCs - with pairs   Accelerated essential hypertension   ACS (acute coronary  syndrome)   Acute systolic HF (heart failure)  1. NSTEMI due to 99% LCX/OM stenosis treated with Angiosculpt scoring balloon 2. Occasional PVC's 3. H/O tobacco: pt vows to quit 4. HTN 5. Hypo K: replete  Will further titrate carvedilol to 6.25 mg bid today. Check Lipid panel. Discussed smoking cessation. F/U ECG. Will transfer to telemetry; home in 24 - 48 hrs if stable.   A. , MD, FACC 11/28/2014, 10:00 AM 

## 2014-11-28 NOTE — Progress Notes (Signed)
CARDIAC REHAB PHASE I   PRE:  Rate/Rhythm: 75 SR with PVC    BP: sitting 132/80    SaO2: 100 RA  MODE:  Ambulation: 900 ft   POST:  Rate/Rhythm: 94 SR with PVC    BP: sitting 138/88     SaO2: 100 RA  Tolerated very well, feels good. Ed completed with great reception. Pt very motivated to quit smoking and be healthier. Interested in [facility] and will send referral to [facility]. Gave pt financial aid application. Pt needs $4 prescriptions. [phone]   Elissa Lovett [place] CES, ACSM 11/28/2014 3:25 PM

## 2014-11-28 NOTE — Care Management Note (Addendum)
Case Management Note  Patient Details  Name: Breanna Khan MRN: [id number] Date of Birth: 05/13/1966  Subjective/Objective:     Pt does not have PCP and requests information re [facility].  Has orange card, states she definitely wants to quit smoking and take medications as prescribed.  Provided brochure for [facility], pt plans to call for appointment.   Breanna Khan Black RN [facility]   11/28/14            Expected Discharge Date:                  Expected Discharge Plan:  Home/Self Care  In-House Referral:     Discharge planning Services  CM Consult, Medication Assistance  Post Acute Care Choice:    Choice offered to:     DME Arranged:    DME Agency:     HH Arranged:    HH Agency:     Status of Service:  In process, will continue to follow  Medicare Important Message Given:    Date Medicare IM Given:    Medicare IM give by:    Date Additional Medicare IM Given:    Additional Medicare Important Message give by:     If discussed at Long Length of Stay Meetings, dates discussed:    Additional Comments:

## 2014-11-28 NOTE — Progress Notes (Signed)
Utilization review completed.  

## 2014-11-29 DIAGNOSIS — I5021 Acute systolic (congestive) heart failure: Secondary | ICD-10-CM

## 2014-11-29 LAB — LIPID PANEL
Cholesterol: 130 mg/dL (ref 0–200)
HDL: 39 mg/dL — ABNORMAL LOW (ref >40)
LDL CALC: 79 mg/dL (ref 0–99)
TRIGLYCERIDES: 62 mg/dL (ref <150)
Total CHOL/HDL Ratio: 3.3 RATIO
VLDL: 12 mg/dL (ref 0–40)

## 2014-11-29 LAB — MAGNESIUM: MAGNESIUM: 1.6 mg/dL — AB (ref 1.7–2.4)

## 2014-11-29 LAB — BASIC METABOLIC PANEL
Anion gap: 8 (ref 5–15)
BUN: 5 mg/dL — ABNORMAL LOW (ref 6–20)
CO2: 27 mmol/L (ref 22–32)
Calcium: 8.3 mg/dL — ABNORMAL LOW (ref 8.9–10.3)
Chloride: 102 mmol/L (ref 101–111)
Creatinine, Ser: 0.62 mg/dL (ref 0.44–1.00)
GFR calc Af Amer: >60 mL/min (ref >60)
GFR calc non Af Amer: >60 mL/min (ref >60)
GLUCOSE: 87 mg/dL (ref 65–99)
POTASSIUM: 3.5 mmol/L (ref 3.5–5.1)
Sodium: 137 mmol/L (ref 135–145)

## 2014-11-29 LAB — CBC
HCT: 46.6 % — ABNORMAL HIGH (ref 36.0–46.0)
Hemoglobin: 16.3 g/dL — ABNORMAL HIGH (ref 12.0–15.0)
MCH: 33.1 pg (ref 26.0–34.0)
MCHC: 35 g/dL (ref 30.0–36.0)
MCV: 94.7 fL (ref 78.0–100.0)
PLATELETS: 273 10*3/uL (ref 150–400)
RBC: 4.92 MIL/uL (ref 3.87–5.11)
RDW: 14 % (ref 11.5–15.5)
WBC: 6.8 10*3/uL (ref 4.0–10.5)

## 2014-11-29 LAB — TSH: TSH: 1.115 u[IU]/mL (ref 0.350–4.500)

## 2014-11-29 LAB — T4, FREE: Free T4: 0.8 ng/dL (ref 0.61–1.12)

## 2014-11-29 MED ORDER — POTASSIUM CHLORIDE CRYS ER 20 MEQ PO TBCR: 20.0000 meq | EXTENDED_RELEASE_TABLET | Freq: Every day | ORAL | Status: DC

## 2014-11-29 MED ORDER — CARVEDILOL 12.5 MG PO TABS
12.5000 mg | ORAL_TABLET | Freq: Two times a day (BID) | ORAL | Status: DC
  Administered 2014-11-29 – 2014-11-30 (×2): 12.5 mg via ORAL
  Filled 2014-11-29 (×4): qty 1

## 2014-11-29 MED ORDER — POTASSIUM CHLORIDE CRYS ER 20 MEQ PO TBCR
40.0000 meq | EXTENDED_RELEASE_TABLET | Freq: Once | ORAL | Status: AC
  Administered 2014-11-29: 40 meq via ORAL
  Filled 2014-11-29: qty 2

## 2014-11-29 MED ORDER — MAGNESIUM OXIDE 400 (241.3 MG) MG PO TABS
400.0000 mg | ORAL_TABLET | Freq: Two times a day (BID) | ORAL | Status: DC
  Administered 2014-11-29 – 2014-11-30 (×3): 400 mg via ORAL
  Filled 2014-11-29 (×4): qty 1

## 2014-11-29 MED ORDER — POTASSIUM CHLORIDE CRYS ER 20 MEQ PO TBCR
40.0000 meq | EXTENDED_RELEASE_TABLET | Freq: Every day | ORAL | Status: DC
  Administered 2014-11-29 – 2014-11-30 (×2): 40 meq via ORAL
  Filled 2014-11-29 (×2): qty 2

## 2014-11-29 NOTE — Progress Notes (Addendum)
Patient: Breanna Khan / Admit Date: 11/27/2014 / Date of Encounter: 11/29/2014, 11:27 AM   Subjective: No CP, SOB, or awareness of ectopy.    Objective: Telemetry: NSR with frequent PVCs, sometimes couplets, rare bigeminy Physical Exam: Blood pressure 115/68, pulse 67, temperature 98.2 F (36.8 C), temperature source Oral, resp. rate 18, height  (1.88 m), weight 198 lb 3.1 oz (89.9 kg), SpO2 100 %. General: Well developed, well nourished F, in no acute distress. Head: Normocephalic, atraumatic, sclera non-icteric, no xanthomas, nares are without discharge. Neck: JVP not elevated. Lungs: Clear bilaterally to auscultation without wheezes, rales, or rhonchi. Breathing is unlabored. Heart: RRR but occasional ectopy, S1 S2 without murmurs, rubs, or gallops.  Abdomen: Soft, non-tender, non-distended with normoactive bowel sounds. No rebound/guarding. Extremities: No clubbing or cyanosis. No edema. Distal pedal pulses are 2+ and equal bilaterally. Right wrist cath site without hematoma, ecchymosis. Good pulse. Neuro: Alert and oriented X 3. Moves all extremities spontaneously. Psych:  Responds to questions appropriately with a normal affect.   Intake/Output Summary (Last 24 hours) at 11/29/14 1127 Last data filed at 11/29/14 0800  Gross per 24 hour  Intake    240 ml  Output      0 ml  Net    240 ml    Inpatient Medications:  . aspirin  81 mg Oral Daily  . atorvastatin  80 mg Oral q1800  . carvedilol  6.25 mg Oral BID WC  . clopidogrel  75 mg Oral Q breakfast  . lisinopril  5 mg Oral Daily  . potassium chloride  40 mEq Oral Daily  . sodium chloride  3 mL Intravenous Q12H   Infusions:    Labs:  Recent Labs  11/28/14 0026 11/29/14 0319  NA 135 137  K 3.1* 3.5  CL 102 102  CO2 21* 27  GLUCOSE 87 87  BUN 8 5*  CREATININE 0.59 0.62  CALCIUM 7.9* 8.3*  MG  --  1.6*    Recent Labs  11/27/14 0914  AST 44*  ALT 26  ALKPHOS 54  BILITOT 0.6  PROT 7.2  ALBUMIN 3.8     Recent Labs  11/28/14 0026 11/29/14 0319  WBC 7.7 6.8  HGB 14.5 16.3*  HCT 41.9 46.6*  MCV 93.7 94.7  PLT 268 273    Recent Labs  11/27/14 1707 11/27/14 1829 11/28/14 0026  TROPONINI 0.34* 0.37* 0.35*   Invalid input(s): POCBNP No results for input(s): HGBA1C in the last 72 hours.   Radiology/Studies:  Dg Chest Port 1 View  11/27/2014   CLINICAL DATA:  Intermittent chest pain for 3 days. Diaphoresis and shortness of breath.  EXAM: PORTABLE CHEST - 1 VIEW  COMPARISON:  10/06/2014  FINDINGS: Cardiac silhouette appears mildly enlarged, accentuated by portable AP technique and shallower inspiration than on the prior study. Mild peribronchial thickening is again seen. Patchy opacities in the left greater than right lung bases are new. No overt pulmonary edema, definite pleural effusion, or pneumothorax is identified. No acute osseous abnormality is identified.  IMPRESSION: Shallower inspiration with patchy bibasilar opacities, likely atelectasis.   Electronically Signed   By: Sebastian Ache   On: 11/27/2014 09:44     Assessment and Plan  1. NSTEMI/CAD 2. Frequent PVCs 3. Tobacco abuse, counseled 4. Accelerated HTN, improved 5. Hypokalemia 6. 2D Echo findings of EF 55-60%, grade 1 DD, severe LAE, mild RAE, mod TR, dilated IVC >3 that does not collapse  Doing well from ACS standpoint. Continue ASA, BB, statin,  Plavix.  The biggest issue at present time is the frequency of PVCs which sounds like what was noted on admission. K was 3.1 yesterday, up to 3.5 today without intervention. Will start KCl daily. 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 Will check Mg level and thyroid function. BP is 115/68 before any medications this AM so it's not clear that we have room to titrate BB further.  If PVCs do not quiet down with improved potassium level, consider 48-hour holter to quantify burden. Await MD decision regarding timing of  DC.  Signed, Ronie Spies PA-C Pager: [phone]   Patient seen and examined. Agree with assessment and plan. Asymptomatic PVC.  Mg 1.6; K 3.5. Will give Mg Oxide 400 mg bid for several doses; KCL 40 meq now.  Will titrate bb and f/u BP. Pt prefers to stay today.  Will continue to observe rhythm, and plan dc tomorrow.   Lennette Bihari, MD, [facility] 11/29/2014 11:27 AM

## 2014-11-29 NOTE — Clinical Documentation Improvement (Addendum)
"  Acute Systolic Heart Failure" is documented in the dictated cath lab report dated 11/27/14.  Unable to locate any documentation that the patient has received IV Lasix this admission.  EF by LVG less than 25% with an LV EDP of 22 mmHg.  EF by Echo is 55-60% with grade 1 diastolic dysfunction, severely enlarged left atrium, moderate TR, PA peak systolic pressure 41 mm hg, and elevated central venous pressure.  Please clarify if the patient has had "Acute Systolic Heart Failure" this admission or not.   Thank You, Jerral Ralph ,RN Clinical Documentation Specialist:  [phone] [facility]- Health Information Management

## 2014-11-29 NOTE — Progress Notes (Signed)
CARDIAC REHAB PHASE I   PRE:  Rate/Rhythm: 72 SR with occ PVC    BP:     SaO2:   MODE:  Ambulation: 850 ft   POST:  Rate/Rhythm: 81 SR with occ PVC    BP: sitting right 140/90, left 150/80     SaO2:   Pt feels well, no c/o. Monitored rhythm entire walk. Has periods of no PVCS, ~30-40 secs, then resumes 1 PVC every 7 bts or so.  I did not take BP before walk as pt was up moving quickly but after walk BP was elevated (see above). Also see my note from yesterday. Pt without questions about d/c.  [id number]  Elissa Lovett [place] CES, ACSM 11/29/2014 10:45 AM

## 2014-11-29 NOTE — Hospital Discharge Follow-Up (Signed)
Received communication from Marvetta Gibbons, RN CM that patient has no insurance and needs a PCP.  Met with patient and spoke with patient about [place]. Patient indicates she would like to establish care at [facility].  Appointment obtained for patient on 12/04/14 at 1000 with Dr. Jarold Song. Patient appreciative of appointment. Marvetta Gibbons, RN CM updated.

## 2014-11-30 LAB — MAGNESIUM: Magnesium: 1.7 mg/dL (ref 1.7–2.4)

## 2014-11-30 LAB — BASIC METABOLIC PANEL
Anion gap: 6 (ref 5–15)
BUN: 10 mg/dL (ref 6–20)
CHLORIDE: 103 mmol/L (ref 101–111)
CO2: 27 mmol/L (ref 22–32)
CREATININE: 0.68 mg/dL (ref 0.44–1.00)
Calcium: 8.5 mg/dL — ABNORMAL LOW (ref 8.9–10.3)
GFR calc Af Amer: >60 mL/min (ref >60)
GFR calc non Af Amer: >60 mL/min (ref >60)
GLUCOSE: 87 mg/dL (ref 65–99)
POTASSIUM: 4 mmol/L (ref 3.5–5.1)
Sodium: 136 mmol/L (ref 135–145)

## 2014-11-30 MED ORDER — ATORVASTATIN CALCIUM 80 MG PO TABS: 80.0000 mg | ORAL_TABLET | Freq: Every day | ORAL | Status: DC

## 2014-11-30 MED ORDER — POTASSIUM CHLORIDE CRYS ER 20 MEQ PO TBCR: 40.0000 meq | EXTENDED_RELEASE_TABLET | Freq: Every day | ORAL | Status: DC

## 2014-11-30 MED ORDER — ASPIRIN 81 MG PO CHEW: 81.0000 mg | CHEWABLE_TABLET | Freq: Every day | ORAL | Status: DC

## 2014-11-30 MED ORDER — MAGNESIUM OXIDE 400 (241.3 MG) MG PO TABS: 400.0000 mg | ORAL_TABLET | Freq: Two times a day (BID) | ORAL | Status: DC

## 2014-11-30 MED ORDER — LISINOPRIL 5 MG PO TABS: 5.0000 mg | ORAL_TABLET | Freq: Every day | ORAL | Status: DC

## 2014-11-30 MED ORDER — CARVEDILOL 12.5 MG PO TABS: 12.5000 mg | ORAL_TABLET | Freq: Two times a day (BID) | ORAL | Status: DC

## 2014-11-30 MED ORDER — CLOPIDOGREL BISULFATE 75 MG PO TABS: 75.0000 mg | ORAL_TABLET | Freq: Every day | ORAL | Status: DC

## 2014-11-30 NOTE — Progress Notes (Deleted)
Physician Discharge Summary  Patient ID: Breanna Khan MRN: [id number] DOB/AGE: Aug 02, 1965 49 y.o.   Primary Cardiologist: Dr. Herbie Baltimore  Admit date: 11/27/2014 Discharge date: 11/30/2014  Admission Diagnoses: ACS/ NSTEMI  Discharge Diagnoses:  Principal Problem:   Acute coronary syndrome Active Problems:   Smokes 1/2 pack per day   Frequent PVCs - with pairs   Accelerated essential hypertension   ACS (acute coronary syndrome)   Acute systolic HF (heart failure)   Discharged Condition: stable  Hospital Course: 49 y.o. female with a past medical history significant for 20-pack-year smoking history but otherwise no recorded history of hypertension, hyperlipidemia or diabetes who presented to [facility] on 11/27/14 via EMS with ACS. Upon EMS arrival she was found to be profoundly hypertensive with blood pressures of 220/110 with frequent PVCs and what appeared to be lateral ST depressions. Telemetry strips from EMS showed very frequent PVCs in mostly bigeminy pattern but also with couplets and triplets of PVCs. She was given sublingual nitroglycerin by EMS 2 along with IV morphine. 324 mg oral aspirin was administered. Upon arrival to [facility] ER she was relatively pain-free and blood pressures were down to the 150s over 100s. She was still having bigeminy PVCs. The normal QRS complexes in the lateral cortical leads show likely LVH with flat ST segments (as seen by EKG from April 2016) however the ST segments were depressed at least 1-2 mm in leads V4 through V6. Her presentation was concerning for high risk acute coronary syndrome. She was taken urgently to the cath lab. The procedure was performed by Dr. Katrinka Blazing. She was found to have high-grade obstruction of the ostium of a large branching first obtuse marginal. She underwent successful scoring balloon angioplasty on a 95-99% ostial first obtuse marginal with reduction in stenosis to less than 20% with TIMI grade 3 flow. All other vessels were  patent. She was also noted to have severe LVF at time of cath with EF at 15-20%. She tolerated the procedure well and left the cath lab in stable condition. She had no further CP. She was placed on DAPT with ASA + Plavix. She was also started on Coreg, lisinopril and atorvastatin. A post PCI echo was performed which showed significant improvement in LV systolic function with EF at 55-60%. During recovery, she continued to have frequent PVCs. TSH was normal. She did have mild hypokalemia and hypomagnesimia and required supplementation. Her BB was also increased. The frequency of her PVCs resolved with supplemenation of electrolytes and BB therapy. She had no other issues. She was last seen and examined by Dr. Ladona Ridgel who determined she was stable for discharge home. TOC post hospital f/u will be arranged in our office.     Consults: None  Significant Diagnostic Studies:  LHC 11/27/14  Conclusion    1. Ost 1st Mrg lesion, 99% stenosed. There is a 20% residual stenosis post intervention.   Acute coronary syndrome presentation due to high-grade obstruction of the ostium of a large branching first obtuse marginal.  Severe systolic heart failure, possibly acute, etiology uncertain. EF 15-20%.  Successful scoring balloon angioplasty on a 95-99% ostial first obtuse marginal with reduction in stenosis to less than 20% with TIMI grade 3 flow  Otherwise widely patent coronary arteries     2D Echo 11/27/14 Study Conclusions  - Left ventricle: The cavity size was normal. Wall thickness was normal. Systolic function was normal. The estimated ejection fraction was in the range of 55% to 60%. Wall motion was normal; there were  no regional wall motion abnormalities. Doppler parameters are consistent with abnormal left ventricular relaxation (grade 1 diastolic dysfunction). The E/e&' ratio is between 8-15, suggesting indeterminate LV filling pressure. - Left atrium: Severely dilated at 48  ml/m2. - Right atrium: Mildly dilated at 21 cm2. - Tricuspid valve: There was moderate regurgitation. - Pulmonary arteries: PA peak pressure: 41 mm Hg (S). - Inferior vena cava: The vessel was dilated. The respirophasic diameter changes were blunted (< 50%), consistent with elevated central venous pressure.  Impressions:  - LVEF 55-60%, normal wall thickness, normal wall motion, diastolic dysfunction, indeterminate LV filling pressure, severe LAE, mild RAE, moderate TR, RVSP 41 mmHg, dilated IVC to >3 cm that does not collapse.   Treatments: See Hospital Course  Discharge Exam: Blood pressure 137/69, pulse 72, temperature 97.8 F (36.6 C), temperature source Oral, resp. rate 18, height  (1.88 m), weight 198 lb 3.1 oz (89.9 kg), SpO2 99 %.   Disposition: 01-Home or Self Care      Discharge Instructions    Amb Referral to Cardiac Rehabilitation    Complete by:  As directed   Congestive Heart Failure: If diagnosis is Heart Failure, patient MUST meet each of the CMS criteria: 1. Left Ventricular Ejection Fraction </= 35% 2. NYHA class II-IV symptoms despite being on optimal heart failure therapy for at least 6 weeks. 3. Stable = have not had a recent (<6 weeks) or planned (<6 months) major cardiovascular hospitalization or procedure  Program Details: - Physician supervised classes - 1-3 classes per week over a 12-18 week period, generally for a total of 36 sessions  Physician Certification: I certify that the above Cardiac Rehabilitation treatment is medically necessary and is medically approved by me for treatment of this patient. The patient is willing and cooperative, able to ambulate and medically stable to participate in exercise rehabilitation. The participant's progress and Individualized Treatment Plan will be reviewed by the Medical Director, Cardiac Rehab staff and as indicated by the Referring/Ordering Physician.  Diagnosis:   PCI Myocardial Infarction         Diet - low sodium heart healthy    Complete by:  As directed      Increase activity slowly    Complete by:  As directed             Medication List    STOP taking these medications        guaiFENesin 100 MG/5ML liquid  Commonly known as:  ROBITUSSIN     ibuprofen 200 MG tablet  Commonly known as:  ADVIL,MOTRIN     pseudoephedrine 30 MG tablet  Commonly known as:  SUDAFED      TAKE these medications        aspirin 81 MG chewable tablet  Chew 1 tablet (81 mg total) by mouth daily.     atorvastatin 80 MG tablet  Commonly known as:  LIPITOR  Take 1 tablet (80 mg total) by mouth daily at 6 PM.     carvedilol 12.5 MG tablet  Commonly known as:  COREG  Take 1 tablet (12.5 mg total) by mouth 2 (two) times daily with a meal.     clopidogrel 75 MG tablet  Commonly known as:  PLAVIX  Take 1 tablet (75 mg total) by mouth daily with breakfast.     lisinopril 5 MG tablet  Commonly known as:  PRINIVIL,ZESTRIL  Take 1 tablet (5 mg total) by mouth daily.     magnesium oxide 400 (241.3 MG) MG  tablet  Commonly known as:  MAG-OX  Take 1 tablet (400 mg total) by mouth 2 (two) times daily.     potassium chloride SA 20 MEQ tablet  Commonly known as:  K-DUR,KLOR-CON  Take 2 tablets (40 mEq total) by mouth daily.       Follow-up Information    Follow up with [facility]     On 12/04/2014.   Why:  Appointment on 12/04/14 at 10:00am with Dr. Carleene Overlie information:   [address] [place] [address] [phone]      Follow up with Marykay Lex, MD.   Specialty:  Cardiology   Why:  our office will call you with a follow-up appointment   Contact information:   [address] [place] [address] [phone]      TIME SPENT ON DISCHARGE, INCLUDING PHYSICIAN TIME: >30 MINUTES  Signed: Robbie Lis 11/30/2014, 11:51 AM

## 2014-11-30 NOTE — Progress Notes (Signed)
Patient ID: Breanna Khan, female   DOB: May 12, 1966, 49 y.o.   MRN: [id number]    Patient Name: Breanna Khan Date of Encounter: 11/30/2014     Principal Problem:   Acute coronary syndrome Active Problems:   Smokes 1/2 pack per day   Frequent PVCs - with pairs   Accelerated essential hypertension   ACS (acute coronary syndrome)   Acute systolic HF (heart failure)    SUBJECTIVE  No chest pain or sob. "I have stopped smoking." Anxious about going home.  CURRENT MEDS . aspirin  81 mg Oral Daily  . atorvastatin  80 mg Oral q1800  . carvedilol  12.5 mg Oral BID WC  . clopidogrel  75 mg Oral Q breakfast  . lisinopril  5 mg Oral Daily  . magnesium oxide  400 mg Oral BID  . potassium chloride  40 mEq Oral Daily  . sodium chloride  3 mL Intravenous Q12H    OBJECTIVE  Filed Vitals:   11/29/14 1513 11/29/14 2058 11/30/14 0547 11/30/14 0940  BP: 145/67 123/79 140/81 137/69  Pulse: 56 58 72   Temp:  97.5 F (36.4 C) 97.8 F (36.6 C)   TempSrc:  Oral Oral   Resp: 18  18   Height:      Weight:      SpO2: 99% 100% 99%     Intake/Output Summary (Last 24 hours) at 11/30/14 1124 Last data filed at 11/29/14 1645  Gross per 24 hour  Intake    480 ml  Output      0 ml  Net    480 ml   Filed Weights   11/27/14 0913 11/27/14 1510  Weight: 195 lb (88.451 kg) 198 lb 3.1 oz (89.9 kg)    PHYSICAL EXAM  General: Pleasant, NAD. Neuro: Alert and oriented X 3. Moves all extremities spontaneously. Psych: Normal affect. HEENT:  Normal  Neck: Supple without bruits or JVD. Lungs:  Resp regular and unlabored, CTA. Heart: RRR no s3, s4, or murmurs. Abdomen: Soft, non-tender, non-distended, BS + x 4.  Extremities: No clubbing, cyanosis or edema. DP/PT/Radials 2+ and equal bilaterally.  Accessory Clinical Findings  CBC  Recent Labs  11/28/14 0026 11/29/14 0319  WBC 7.7 6.8  HGB 14.5 16.3*  HCT 41.9 46.6*  MCV 93.7 94.7  PLT 268 273   Basic Metabolic Panel  Recent  Labs  21/30/86 0319 11/30/14 0342  NA 137 136  K 3.5 4.0  CL 102 103  CO2 27 27  GLUCOSE 87 87  BUN 5* 10  CREATININE 0.62 0.68  CALCIUM 8.3* 8.5*  MG 1.6* 1.7   Liver Function Tests No results for input(s): AST, ALT, ALKPHOS, BILITOT, PROT, ALBUMIN in the last 72 hours. No results for input(s): LIPASE, AMYLASE in the last 72 hours. Cardiac Enzymes  Recent Labs  11/27/14 1707 11/27/14 1829 11/28/14 0026  TROPONINI 0.34* 0.37* 0.35*   BNP Invalid input(s): POCBNP D-Dimer No results for input(s): DDIMER in the last 72 hours. Hemoglobin A1C No results for input(s): HGBA1C in the last 72 hours. Fasting Lipid Panel  Recent Labs  11/29/14 0319  CHOL 130  HDL 39*  LDLCALC 79  TRIG 62  CHOLHDL 3.3   Thyroid Function Tests  Recent Labs  11/29/14 1126  TSH 1.115    TELE  nsr with pvc's.  Radiology/Studies  Dg Chest Port 1 View  11/27/2014   CLINICAL DATA:  Intermittent chest pain for 3 days. Diaphoresis and shortness of breath.  EXAM: PORTABLE  CHEST - 1 VIEW  COMPARISON:  10/06/2014  FINDINGS: Cardiac silhouette appears mildly enlarged, accentuated by portable AP technique and shallower inspiration than on the prior study. Mild peribronchial thickening is again seen. Patchy opacities in the left greater than right lung bases are new. No overt pulmonary edema, definite pleural effusion, or pneumothorax is identified. No acute osseous abnormality is identified.  IMPRESSION: Shallower inspiration with patchy bibasilar opacities, likely atelectasis.   Electronically Signed   By: Sebastian Ache   On: 11/27/2014 09:44    ASSESSMENT AND PLAN  1. NSTEMI - no anginal symptoms.  2.HTN - blood pressure minimall elevated. Should get better with uptitration beta blocker over time. 3. PVC's -continue beta blocker 4. Tobacco abuse - encouraged to stop smoking Rec: ok for discharge. Continue current meds. She may return to work 12/20/2014.   Luvenia Cranford,M.D.  11/30/2014 11:24  AM

## 2014-11-30 NOTE — Progress Notes (Signed)
Patient needs help with discharge medications.Case management notified.

## 2014-11-30 NOTE — Care Management Note (Signed)
Case Management Note  Patient Details  Name: Breanna Khan MRN: [id number] Date of Birth: July 03, 1965  Subjective/Objective:                  episodes of 5-10 minutes of central substernal chest pressure with exertion  Action/Plan:  Discharge planning Expected Discharge Date:  11/30/14               Expected Discharge Plan:  Home/Self Care  In-House Referral:     Discharge planning Services  CM Consult, Medication Assistance, [facility]  Post Acute Care Choice:    Choice offered to:     DME Arranged:    DME Agency:     HH Arranged:    HH Agency:     Status of Service:  Completed, signed off  Medicare Important Message Given:    Date Medicare IM Given:    Medicare IM give by:    Date Additional Medicare IM Given:    Additional Medicare Important Message give by:     If discussed at [place] of Stay Meetings, dates discussed:    Additional Comments: CM met with pt and gave pt a MATCH letter with list of participating pharmacies.  Pt verbalized understanding of all MATCH parameters.  Previous CM Steffanie Dunn) has set up an appt with Dr. Jarold Song 12/04/14 at 10:00 for follow up care.  Pt verbalizes understanding she is to go to the clinic at this time for follow up; insurance; and future medication asst.  No other CM needs were communicated. Dellie Catholic, RN 11/30/2014, 2:48 PM

## 2014-11-30 NOTE — Progress Notes (Signed)
Discharge instructions given to patient and verbalized understanding. 

## 2014-11-30 NOTE — Progress Notes (Signed)
CARDIAC REHAB PHASE I   PRE:  Rate/Rhythm: 54 sinus brady  BP:  Supine:   Sitting: 118/66  Standing:     MODE:  Ambulation: 890 ft   POST:  Rate/Rhythem: 69  BP:  Supine:   Sitting: 142/72  Standing:   Pt ambulated 890 ft with assist x1. Tolerated walk well without complaint.  Reviewed financial assistance process for rehab with pt.  Pt does want to make sure that she gets $4 Rx in order for her to be able to afford them.  Encouraged pt to talk to MD and nurse before going home to just double check on this.  Pt voiced understanding.  Pt returned to bedside after walk with call bell and phone in reach. Fabio Pierce, MA, ACSM RCEP [phone]  Hazle Nordmann

## 2014-12-01 NOTE — Discharge Summary (Addendum)
Physician Discharge Summary  Patient ID: Breanna Khan MRN: [id number] DOB/AGE: 49-27-1967 49 y.o.   Primary Cardiologist: Dr. Herbie Baltimore  Admit date: 11/27/2014 Discharge date: 11/30/2014  Admission Diagnoses: ACS/ NSTEMI  Discharge Diagnoses:  Principal Problem:   Acute coronary syndrome Active Problems:   Smokes 1/2 pack per day   Frequent PVCs - with pairs   Accelerated essential hypertension   ACS (acute coronary syndrome)   Acute LV dysfunction which resolved after PCI   Discharged Condition: stable  Hospital Course: 49 y.o. female with a past medical history significant for 20-pack-year smoking history but otherwise no recorded history of hypertension, hyperlipidemia or diabetes who presented to [facility] on 11/27/14 via EMS with ACS. Upon EMS arrival she was found to be profoundly hypertensive with blood pressures of 220/110 with frequent PVCs and what appeared to be lateral ST depressions. Telemetry strips from EMS showed very frequent PVCs in mostly bigeminy pattern but also with couplets and triplets of PVCs. She was given sublingual nitroglycerin by EMS 2 along with IV morphine. 324 mg oral aspirin was administered. Upon arrival to [facility] ER she was relatively pain-free and blood pressures were down to the 150s over 100s. She was still having bigeminy PVCs. The normal QRS complexes in the lateral cortical leads show likely LVH with flat ST segments (as seen by EKG from April 2016) however the ST segments were depressed at least 1-2 mm in leads V4 through V6. Her presentation was concerning for high risk acute coronary syndrome. She was taken urgently to the cath lab. The procedure was performed by Dr. Katrinka Blazing. She was found to have high-grade obstruction of the ostium of a large branching first obtuse marginal. She underwent successful scoring balloon angioplasty on a 95-99% ostial first obtuse marginal with reduction in stenosis to less than 20% with TIMI grade 3 flow. All other  vessels were patent. She was also noted to have severe LV dysfunction at time of cath with EF at 15-20%. She tolerated the procedure well and left the cath lab in stable condition. She had no further CP. She was placed on DAPT with ASA + Plavix. She was also started on Coreg, lisinopril and atorvastatin. A post PCI echo was performed which showed significant improvement in LV systolic function with EF at 55-60%. During recovery, she continued to have frequent PVCs. TSH was normal. She did have mild hypokalemia and hypomagnesimia and required supplementation. Her BB was also increased. The frequency of her PVCs resolved with supplemenation of electrolytes and BB therapy. She had no other issues. She was last seen and examined by Dr. Ladona Ridgel who determined she was stable for discharge home. TOC post hospital f/u will be arranged in our office.     Consults: None  Significant Diagnostic Studies:  LHC 11/27/14  Conclusion    1. Ost 1st Mrg lesion, 99% stenosed. There is a 20% residual stenosis post intervention.   Acute coronary syndrome presentation due to high-grade obstruction of the ostium of a large branching first obtuse marginal.  Severe LV dysfunction. EF 15-20%.  Successful scoring balloon angioplasty on a 95-99% ostial first obtuse marginal with reduction in stenosis to less than 20% with TIMI grade 3 flow  Otherwise widely patent coronary arteries     2D Echo 11/27/14 Study Conclusions  - Left ventricle: The cavity size was normal. Wall thickness was normal. Systolic function was normal. The estimated ejection fraction was in the range of 55% to 60%. Wall motion was normal; there were no regional  wall motion abnormalities. Doppler parameters are consistent with abnormal left ventricular relaxation (grade 1 diastolic dysfunction). The E/e&' ratio is between 8-15, suggesting indeterminate LV filling pressure. - Left atrium: Severely dilated at 48 ml/m2. - Right  atrium: Mildly dilated at 21 cm2. - Tricuspid valve: There was moderate regurgitation. - Pulmonary arteries: PA peak pressure: 41 mm Hg (S). - Inferior vena cava: The vessel was dilated. The respirophasic diameter changes were blunted (< 50%), consistent with elevated central venous pressure.  Impressions:  - LVEF 55-60%, normal wall thickness, normal wall motion, diastolic dysfunction, indeterminate LV filling pressure, severe LAE, mild RAE, moderate TR, RVSP 41 mmHg, dilated IVC to >3 cm that does not collapse.   Treatments: See Hospital Course  Discharge Exam: Blood pressure 137/69, pulse 72, temperature 97.8 F (36.6 C), temperature source Oral, resp. rate 18, height 6\' 2"  (1.88 m), weight 198 lb 3.1 oz (89.9 kg), SpO2 99 %.   Disposition: 01-Home or Self Care      Discharge Instructions    Amb Referral to Cardiac Rehabilitation    Complete by:  As directed   Acute coronary syndrome:   Diagnosis:   PCI Myocardial Infarction       Diet - low sodium heart healthy    Complete by:  As directed      Increase activity slowly    Complete by:  As directed             Medication List    STOP taking these medications        guaiFENesin 100 MG/5ML liquid  Commonly known as:  ROBITUSSIN     ibuprofen 200 MG tablet  Commonly known as:  ADVIL,MOTRIN     pseudoephedrine 30 MG tablet  Commonly known as:  SUDAFED      TAKE these medications        aspirin 81 MG chewable tablet  Chew 1 tablet (81 mg total) by mouth daily.     atorvastatin 80 MG tablet  Commonly known as:  LIPITOR  Take 1 tablet (80 mg total) by mouth daily at 6 PM.     carvedilol 12.5 MG tablet  Commonly known as:  COREG  Take 1 tablet (12.5 mg total) by mouth 2 (two) times daily with a meal.     clopidogrel 75 MG tablet  Commonly known as:  PLAVIX  Take 1 tablet (75 mg total) by mouth daily with breakfast.     lisinopril 5 MG tablet  Commonly known as:  PRINIVIL,ZESTRIL  Take 1  tablet (5 mg total) by mouth daily.     magnesium oxide 400 (241.3 MG) MG tablet  Commonly known as:  MAG-OX  Take 1 tablet (400 mg total) by mouth 2 (two) times daily.     potassium chloride SA 20 MEQ tablet  Commonly known as:  K-DUR,KLOR-CON  Take 2 tablets (40 mEq total) by mouth daily.       Follow-up Information    Follow up with [facility]     On 12/04/2014.   Why:  Appointment on 12/04/14 at 10:00am with Dr. Carleene Overlie information:   [address] [place] [address] [phone]      Follow up with Marykay Lex, MD.   Specialty:  Cardiology   Why:  our office will call you with a follow-up appointment   Contact information:   [address] [place] [address] [phone]  TIME SPENT ON DISCHARGE, INCLUDING PHYSICIAN TIME: >30 MINUTES  Signed: Robbie Lis 11/30/2014, 11:51 AM  Cardiology Attending  Patient seen and examined. Agree with above. Ok for discharge.  Leonia Reeves.D.

## 2014-12-02 ENCOUNTER — Telehealth: Payer: Self-pay | Admitting: Cardiology

## 2014-12-02 NOTE — Telephone Encounter (Signed)
New message    TCM appt on  6.30.2016 with Thayer [facility].    Per [place] Simmon.    Please schedule for TOC in 7-10 days. NSTEMI. Needs early f/u.

## 2014-12-02 NOTE — Telephone Encounter (Signed)
Needs a TOC phone call.Marland Kitchen Appt is 12/12/14 at 8am

## 2014-12-02 NOTE — Telephone Encounter (Signed)
Patient contacted regarding discharge from [facility] on 12/01/2014.  Patient understands to follow up with provider Flavia Shipper, NP on December 12, 2014 at 0800 at [facility]. Patient understands discharge instructions? yes Patient understands medications and regiment? yes Patient understands to bring all medications to this visit? yes  Patient inquired about her medication cost - see CM note

## 2014-12-04 ENCOUNTER — Ambulatory Visit: Payer: Self-pay | Attending: Family Medicine | Admitting: Family Medicine

## 2014-12-04 ENCOUNTER — Encounter: Payer: Self-pay | Admitting: Family Medicine

## 2014-12-04 VITALS — BP 123/81 | HR 57 | Temp 97.9°F | Ht 73.0 in | Wt 198.0 lb

## 2014-12-04 DIAGNOSIS — I10 Essential (primary) hypertension: Secondary | ICD-10-CM | POA: Insufficient documentation

## 2014-12-04 DIAGNOSIS — I5021 Acute systolic (congestive) heart failure: Secondary | ICD-10-CM | POA: Insufficient documentation

## 2014-12-04 DIAGNOSIS — I249 Acute ischemic heart disease, unspecified: Secondary | ICD-10-CM | POA: Insufficient documentation

## 2014-12-04 DIAGNOSIS — I252 Old myocardial infarction: Secondary | ICD-10-CM | POA: Insufficient documentation

## 2014-12-04 DIAGNOSIS — K0490 Unspecified diseases of pulp and periapical tissues: Secondary | ICD-10-CM | POA: Insufficient documentation

## 2014-12-04 DIAGNOSIS — Z87891 Personal history of nicotine dependence: Secondary | ICD-10-CM | POA: Insufficient documentation

## 2014-12-04 DIAGNOSIS — K089 Disorder of teeth and supporting structures, unspecified: Secondary | ICD-10-CM

## 2014-12-04 DIAGNOSIS — Z9861 Coronary angioplasty status: Secondary | ICD-10-CM | POA: Insufficient documentation

## 2014-12-04 MED ORDER — ASPIRIN 81 MG PO CHEW: 81.0000 mg | CHEWABLE_TABLET | Freq: Every day | ORAL | Status: DC

## 2014-12-04 MED ORDER — CLOPIDOGREL BISULFATE 75 MG PO TABS: 75.0000 mg | ORAL_TABLET | Freq: Every day | ORAL | Status: DC

## 2014-12-04 MED ORDER — CARVEDILOL 12.5 MG PO TABS: 12.5000 mg | ORAL_TABLET | Freq: Two times a day (BID) | ORAL | Status: DC

## 2014-12-04 MED ORDER — LISINOPRIL 5 MG PO TABS: 5.0000 mg | ORAL_TABLET | Freq: Every day | ORAL | Status: DC

## 2014-12-04 MED ORDER — POTASSIUM CHLORIDE CRYS ER 20 MEQ PO TBCR: 40.0000 meq | EXTENDED_RELEASE_TABLET | Freq: Every day | ORAL | Status: DC

## 2014-12-04 MED ORDER — ATORVASTATIN CALCIUM 80 MG PO TABS: 80.0000 mg | ORAL_TABLET | Freq: Every day | ORAL | Status: DC

## 2014-12-04 NOTE — Progress Notes (Signed)
Subjective:    Patient ID: Breanna Khan, female    DOB: 1966/03/30, 49 y.o.   MRN: [id number]  HPI  Admit date: 11/27/14 Discharge date: 11/30/14  Breanna Khan is a 49 year old female who presented to the ED via EMS with substernal chest pain and was found to have significantly elevated blood pressure of 220/110 on presentation. Telemetry strips from EMS showed very frequent PVCs in mostly bigeminy pattern but also with couplets and triplets of PVCs.She was given sublingual nitroglycerin by EMS 2 along with IV morphine. 324 mg oral aspirin was administered.   Upon arrival to [facility] ER she was relatively pain-free and blood pressures were down to the 150s/100s. She was still having bigeminy PVCs. The normal QRS complexes in the lateral cortical leads show likely LVH with flat ST segments (as seen by EKG from April 2016) however the ST segments were depressed at least 1-2 mm in leads V4 through V6. She was placed on IV Lopressor for hypertensive emergency and had a cardiac cath on 11/27/14. Cardiac cath revealed high-grade obstruction of the ostium of a large branching first obtuse marginal. She underwent successful scoring balloon angioplasty on a 95-99% ostial first obtuse marginal with reduction in stenosis to less than 20% with TIMI grade 3 flow. All other vessels were patent. She was also noted to have severe LVF at time of cath with EF at 15-20% but a post PCI 2d echo revealed improvement in EF to 55-60% with grade 1 Diastolic dysfunction.  Potassium and magnesium were repleted during her hospital course due to hypokalemia and hypomagnesemia. She was placed on a statin, beta blocker, duel antiplatelet therapy with aspirin and Plavix. Her condition improved and she was discharged once deemed stable.   Interval history: She reports doing well and has no concerns. 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 Also complains of dental issues and has not seen a dentist in a  while; would like for the clinic to place a referral to dentist for her. She does have an upcoming appointment with cardiology on 12/12/2014. We'll also need help with her medications as she currently has no health coverage.  Past Medical History  Diagnosis Date  . Myocardial infarction   . Hypertension   . CHF (congestive heart failure)     Past Surgical History  Procedure Laterality Date  . Abdominal hysterectomy    . Tubal ligation    . Cardiac catheterization N/A 11/27/2014    Procedure: Left Heart Cath and Coronary Angiography;  Surgeon: Lyn Records, MD;  Location: [facility] INVASIVE CV LAB;  Service: Cardiovascular;  Laterality: N/A;  . Cardiac catheterization N/A 11/27/2014    Procedure: Coronary Balloon Angioplasty;  Surgeon: Lyn Records, MD;  Location: [facility] INVASIVE CV LAB;  Service: Cardiovascular;  Laterality: N/A;    History   Social History  . Marital Status: Single    Spouse Name: N/A  . Number of Children: N/A  . Years of Education: N/A   Occupational History  . Not on file.   Social History Main Topics  . Smoking status: Former Smoker -- 0.50 packs/day for 20 years    Types: Cigarettes    Quit date: 11/25/2014  . Smokeless tobacco: Not on file  . Alcohol Use: No     Comment: socially  . Drug Use: No  . Sexual Activity: Not on file   Other Topics Concern  . Not on file   Social History Narrative    No Known  Allergies  Current Outpatient Prescriptions on File Prior to Visit  Medication Sig Dispense Refill  . magnesium oxide (MAG-OX) 400 (241.3 MG) MG tablet Take 1 tablet (400 mg total) by mouth 2 (two) times daily. 60 tablet 5   No current facility-administered medications on file prior to visit.     Review of Systems  Constitutional: Negative for activity change, appetite change and fatigue.  HENT: Negative for congestion, sinus pressure and sore throat.   Eyes: Negative for visual disturbance.  Respiratory: Negative for cough, chest tightness,  shortness of breath and wheezing.   Cardiovascular: Negative for chest pain and palpitations.  Gastrointestinal: Negative for abdominal pain, constipation and abdominal distention.  Endocrine: Negative for polydipsia.  Genitourinary: Negative for dysuria and frequency.  Musculoskeletal: Negative for back pain and arthralgias.  Skin: Negative for rash.  Neurological: Negative for tremors, light-headedness and numbness.  Hematological: Does not bruise/bleed easily.  Psychiatric/Behavioral: Negative for behavioral problems and agitation.         Objective: Filed Vitals:   12/04/14 0947  BP: 123/81  Pulse: 57  Temp: 97.9 F (36.6 C)  Height: 6\' 1"  (1.854 m)  Weight: 198 lb (89.812 kg)  SpO2: 98%      Physical Exam  Constitutional: She is oriented to person, place, and time. She appears well-developed and well-nourished. No distress.  HENT:  Head: Normocephalic.  Right Ear: External ear normal.  Left Ear: External ear normal.  Nose: Nose normal.  Mouth/Throat: Oropharynx is clear and moist.  Poor oral hygiene  Eyes: Conjunctivae and EOM are normal. Pupils are equal, round, and reactive to light.  Neck: Normal range of motion. No JVD present.  Cardiovascular: Normal heart sounds and intact distal pulses.  Bradycardia present.  Exam reveals no gallop.   No murmur heard. Pulmonary/Chest: Effort normal and breath sounds normal. No respiratory distress. She has no wheezes. She has no rales. She exhibits no tenderness.  Abdominal: Soft. Bowel sounds are normal. She exhibits no distension and no mass. There is no tenderness.  Musculoskeletal: Normal range of motion. She exhibits no edema or tenderness.  Neurological: She is alert and oriented to person, place, and time. She has normal reflexes.  Skin: Skin is warm and dry. She is not diaphoretic.  Mild induration on right wrist at the site of cardiac cath which is not tender  Psychiatric: She has a normal mood and affect.             Assessment & Plan:  49 year old female recently hospitalized for acute coronary syndrome status post balloon angioplasty and acute systolic CHF EF 15-20% with resulting improvement post intervention here to establish care.  Acute coronary syndrome: Status post balloon angioplasty with residual 20% stenosis post intervention. Aggressive risk factor modification, continue Plavix and aspirin Advised to keep appointment with cardiology.   Acute systolic heart failure: Possibly acute with EF 15-20% on cardiac cath  Post PCI echo revealed improvement in EF to 55-60% with grade 1 diastolic dysfunction. No evidence of fluid overload at this time. Advised to limit fluids to 2 L per day, daily weight checks, low-sodium diet.  Dental disease: Referred to Dentist as per patient requests.  Return to work on 6/27 with light duty untifully cleared by cardiology. FMLA paperwork completed.  This note has been created with Education officer, environmental. Any transcriptional errors are unintentional.

## 2014-12-04 NOTE — Patient Instructions (Signed)

## 2014-12-04 NOTE — Progress Notes (Signed)
Patient here for [facility] for NSTEMI REports feeling "great", no SOB Quit smoking 2 days before her MI Reports walking up and down her stairs for exercise No drugs/ETOH Has been taking all medications except for aspirin because she thought she was given everything she was supposed to take Patient needs paperwork filled out and needs to reapply for [employer] discount/orange card

## 2014-12-04 NOTE — Telephone Encounter (Signed)
Faxed - signed CARDIAC REHAB PHASE II ORDER.

## 2014-12-11 ENCOUNTER — Encounter: Payer: Self-pay | Admitting: Nurse Practitioner

## 2014-12-11 ENCOUNTER — Ambulatory Visit (INDEPENDENT_AMBULATORY_CARE_PROVIDER_SITE_OTHER): Payer: No Typology Code available for payment source | Admitting: Nurse Practitioner

## 2014-12-11 VITALS — BP 124/82 | HR 49 | Ht 73.0 in | Wt 199.0 lb

## 2014-12-11 DIAGNOSIS — Z955 Presence of coronary angioplasty implant and graft: Secondary | ICD-10-CM

## 2014-12-11 DIAGNOSIS — I249 Acute ischemic heart disease, unspecified: Secondary | ICD-10-CM

## 2014-12-11 DIAGNOSIS — Z720 Tobacco use: Secondary | ICD-10-CM

## 2014-12-11 DIAGNOSIS — I10 Essential (primary) hypertension: Secondary | ICD-10-CM

## 2014-12-11 LAB — BASIC METABOLIC PANEL
BUN: 11 mg/dL (ref 6–23)
CO2: 31 mEq/L (ref 19–32)
Calcium: 9.6 mg/dL (ref 8.4–10.5)
Chloride: 101 mEq/L (ref 96–112)
Creatinine, Ser: 0.77 mg/dL (ref 0.40–1.20)
GFR: 102.57 mL/min (ref >60.00)
Glucose, Bld: 93 mg/dL (ref 70–99)
Potassium: 4.8 mEq/L (ref 3.5–5.1)
Sodium: 136 mEq/L (ref 135–145)

## 2014-12-11 LAB — CBC
HCT: 49.5 % — ABNORMAL HIGH (ref 36.0–46.0)
Hemoglobin: 16.3 g/dL — ABNORMAL HIGH (ref 12.0–15.0)
MCHC: 32.8 g/dL (ref 30.0–36.0)
MCV: 96.8 fl (ref 78.0–100.0)
Platelets: 380 10*3/uL (ref 150.0–400.0)
RBC: 5.12 Mil/uL — ABNORMAL HIGH (ref 3.87–5.11)
RDW: 14.2 % (ref 11.5–15.5)
WBC: 7.1 10*3/uL (ref 4.0–10.5)

## 2014-12-11 LAB — MAGNESIUM: Magnesium: 2.1 mg/dL (ref 1.5–2.5)

## 2014-12-11 NOTE — Progress Notes (Signed)
CARDIOLOGY OFFICE NOTE  Date:  12/11/2014    Darl Householder Date of Birth: [birth year] Medical Record #[id number]  PCP:  No primary care provider on file.  Cardiologist:  Herbie Baltimore    Chief Complaint  Patient presents with  . TOC for NSTEMI with PCI    Seen for Dr. Herbie Baltimore    History of Present Illness: Shanique Aslinger is a 49 y.o. female who presents today for a TOC/post hospital visit. Seen for Dr.Harding. She has a past medical history significant for 20-pack-year smoking history but otherwise no prior history of hypertension, hyperlipidemia or diabetes.   She presented to [facility] on 11/27/14 via EMS with ACS. Upon EMS arrival she was found to be profoundly hypertensive with blood pressures of 220/110 with frequent PVCs and what appeared to be lateral ST depressions. Telemetry strips from EMS showed very frequent PVCs in mostly bigeminy pattern but also with couplets and triplets of PVCs. She was given sublingual nitroglycerin by EMS 2 along with IV morphine. 324 mg oral aspirin was administered. Upon arrival to [facility] ER she was relatively pain-free and blood pressures were down to the 150s over 100s. She was still having bigeminy PVCs. The normal QRS complexes in the lateral cortical leads show likely LVH with flat ST segments (as seen by EKG from April 2016) however the ST segments were depressed at least 1-2 mm in leads V4 through V6. Her presentation was concerning for high risk acute coronary syndrome. She was taken urgently to the cath lab. The procedure was performed by Dr. Katrinka Blazing. She was found to have high-grade obstruction of the ostium of a large branching first obtuse marginal. She underwent successful scoring balloon angioplasty on a 95-99% ostial first obtuse marginal with reduction in stenosis to less than 20% with TIMI grade 3 flow. All other vessels were patent. She was also noted to have severe LV dysfunction at time of cath with EF at 15-20%.  She was placed on DAPT with  ASA + Plavix. She was also started on Coreg, lisinopril and atorvastatin. A post PCI echo was performed which showed significant improvement in LV systolic function with EF at 55-60%.  The frequency of her PVCs resolved with supplemenation of electrolytes and BB therapy.   Comes in today. Here alone. She is doing great. Taking her medicines. No chest pain. Not short of breath. Has stopped smoking - stopped altogether. No problems with her cath site. Not lightheaded or dizzy. She says she has "too much time on her hand" and wants to return to work. 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 She is walking daily. Not interested in cardiac rehab.   Past Medical History  Diagnosis Date  . Myocardial infarction   . Hypertension   . CHF (congestive heart failure)     Past Surgical History  Procedure Laterality Date  . Abdominal hysterectomy    . Tubal ligation    . Cardiac catheterization N/A 11/27/2014    Procedure: Left Heart Cath and Coronary Angiography;  Surgeon: Lyn Records, MD;  Location: [facility] INVASIVE CV LAB;  Service: Cardiovascular;  Laterality: N/A;  . Cardiac catheterization N/A 11/27/2014    Procedure: Coronary Balloon Angioplasty;  Surgeon: Lyn Records, MD;  Location: [facility] INVASIVE CV LAB;  Service: Cardiovascular;  Laterality: N/A;     Medications: Current Outpatient Prescriptions  Medication Sig Dispense Refill  . aspirin 81 MG chewable tablet Chew 1 tablet (81 mg total) by mouth daily. 30 tablet 2  .  atorvastatin (LIPITOR) 80 MG tablet Take 1 tablet (80 mg total) by mouth daily at 6 PM. 30 tablet 2  . carvedilol (COREG) 12.5 MG tablet Take 1 tablet (12.5 mg total) by mouth 2 (two) times daily with a meal. 60 tablet 2  . clopidogrel (PLAVIX) 75 MG tablet Take 1 tablet (75 mg total) by mouth daily with breakfast. 30 tablet 2  . lisinopril (PRINIVIL,ZESTRIL) 5 MG tablet Take 1 tablet (5 mg total) by mouth daily. 30 tablet 2  . magnesium oxide (MAG-OX) 400 (241.3 MG) MG tablet Take  1 tablet (400 mg total) by mouth 2 (two) times daily. 60 tablet 5  . potassium chloride SA (K-DUR,KLOR-CON) 20 MEQ tablet Take 2 tablets (40 mEq total) by mouth daily. 60 tablet 2   No current facility-administered medications for this visit.    Allergies: No Known Allergies  Social History: The patient  reports that she quit smoking about 2 weeks ago. Her smoking use included Cigarettes. She has a 10 pack-year smoking history. She does not have any smokeless tobacco history on file. She reports that she does not drink alcohol or use illicit drugs.   Family History: The patient's family history includes Canavan disease in her father; Cancer in her father; Heart failure in her brother and mother.   Review of Systems: Please see the history of present illness.   Otherwise, the review of systems is positive for none.   All other systems are reviewed and negative.   Physical Exam: VS:  BP 124/82 mmHg  Pulse 49  Ht 6\' 1"  (1.854 m)  Wt 199 lb (90.266 kg)  BMI 26.26 kg/m2 .  BMI Body mass index is 26.26 kg/(m^2).  Wt Readings from Last 3 Encounters:  12/11/14 199 lb (90.266 kg)  12/04/14 198 lb (89.812 kg)  11/27/14 198 lb 3.1 oz (89.9 kg)    General: Pleasant. Well developed, well nourished and in no acute distress.  HEENT: Normal but her dentition is poor - teeth are very yellow.  Neck: Supple, no JVD, carotid bruits, or masses noted.  Cardiac: Regular rate and rhythm. No murmurs, rubs, or gallops. No edema.  Respiratory:  Lungs are clear to auscultation bilaterally with normal work of breathing.  GI: Soft and nontender.  MS: No deformity or atrophy. Gait and ROM intact. Skin: Warm and dry. Color is normal.  Neuro:  Strength and sensation are intact and no gross focal deficits noted.  Psych: Alert, appropriate and with normal affect.   LABORATORY DATA:  EKG:  EKG is ordered today. This demonstrates sinus brady - lateral T wave changes.  Lab Results  Component Value Date    WBC 6.8 11/29/2014   HGB 16.3* 11/29/2014   HCT 46.6* 11/29/2014   PLT 273 11/29/2014   GLUCOSE 87 11/30/2014   CHOL 130 11/29/2014   TRIG 62 11/29/2014   HDL 39* 11/29/2014   LDLCALC 79 11/29/2014   ALT 26 11/27/2014   AST 44* 11/27/2014   NA 136 11/30/2014   K 4.0 11/30/2014   CL 103 11/30/2014   CREATININE 0.68 11/30/2014   BUN 10 11/30/2014   CO2 27 11/30/2014   TSH 1.115 11/29/2014   INR 1.08 11/27/2014   Lab Results  Component Value Date   TROPONINI 0.35* 11/28/2014     BNP (last 3 results)  Recent Labs  11/27/14 0914  BNP 495.0*    ProBNP (last 3 results) No results for input(s): PROBNP in the last 8760 hours.   Other Studies  Reviewed Today: [facility] 11/27/14  Conclusion    1. Ost 1st Mrg lesion, 99% stenosed. There is a 20% residual stenosis post intervention.   Acute coronary syndrome presentation due to high-grade obstruction of the ostium of a large branching first obtuse marginal.  Severe LV dysfunction. EF 15-20%.  Successful scoring balloon angioplasty on a 95-99% ostial first obtuse marginal with reduction in stenosis to less than 20% with TIMI grade 3 flow  Otherwise widely patent coronary arteries     2D Echo 11/27/14 Study Conclusions  - Left ventricle: The cavity size was normal. Wall thickness was normal. Systolic function was normal. The estimated ejection fraction was in the range of 55% to 60%. Wall motion was normal; there were no regional wall motion abnormalities. Doppler parameters are consistent with abnormal left ventricular relaxation (grade 1 diastolic dysfunction). The E/e&' ratio is between 8-15, suggesting indeterminate LV filling pressure. - Left atrium: Severely dilated at 48 ml/m2. - Right atrium: Mildly dilated at 21 cm2. - Tricuspid valve: There was moderate regurgitation. - Pulmonary arteries: PA peak pressure: 41 mm Hg (S). - Inferior vena cava: The vessel was dilated. The  respirophasic diameter changes were blunted (< 50%), consistent with elevated central venous pressure.  Impressions:  - LVEF 55-60%, normal wall thickness, normal wall motion, diastolic dysfunction, indeterminate LV filling pressure, severe LAE, mild RAE, moderate TR, RVSP 41 mmHg, dilated IVC to >3 cm that does not collapse.        Assessment/Plan: 1. NSTEMI - with PCI with scoring balloon angioplasty on a 95-99% ostial first obtuse marginal with reduction in stenosis to less than 20% with TIMI grade 3 flow. All other vessels were patent - she is doing very well clinically. Will let her return to work as of tomorrow. See Dr. Herbie Baltimore in 4 to 6 weeks.   2. Initial LV dysfunction - resolved on post PCI echo.  3. Smoking history - she has stopped - continued cessation encouraged.   4. HTN - BP is good on current regimen.   5. Bradycardia - totally asymptomatic - HR 52 on my exam  6. PVCs - asymptomatic. On beta blocker therapy.   She is doing very well. I have left her on her current regimen.   Current medicines are reviewed with the patient today.  The patient does not have concerns regarding medicines other than what has been noted above.  The following changes have been made:  See above.  Labs/ tests ordered today include:    Orders Placed This Encounter  Procedures  . Basic metabolic panel  . CBC  . Magnesium  . EKG 12-Lead     Disposition:   FU with Dr. Herbie Baltimore in 4 weeks with fasting labs.   Patient is agreeable to this plan and will call if any problems develop in the interim.   Signed: Rosalio Macadamia, RN, ANP-C 12/11/2014 11:54 AM  [facility] [address] [place], [place]  [address] Phone: [phone] Fax: [phone]

## 2014-12-11 NOTE — Patient Instructions (Addendum)
We will be checking the following labs today - BMET, MG and CBC   Medication Instructions:    Continue with your current medicines.     Testing/Procedures To Be Arranged:  N/A  Follow-Up:   See Dr. Herbie Baltimore in 4 to 6 weeks with fasting labs     Other Special Instructions:   Congrats on stopping smoking!!! Keep up the good work.   Call the [facility] office at [phone] if you have any questions, problems or concerns.

## 2014-12-12 ENCOUNTER — Encounter: Payer: Self-pay | Admitting: Nurse Practitioner

## 2014-12-26 ENCOUNTER — Ambulatory Visit: Payer: No Typology Code available for payment source | Attending: Family Medicine | Admitting: Family Medicine

## 2014-12-26 ENCOUNTER — Encounter: Payer: Self-pay | Admitting: Family Medicine

## 2014-12-26 VITALS — BP 116/73 | HR 59 | Temp 97.5°F | Resp 16 | Ht 73.0 in | Wt 196.0 lb

## 2014-12-26 DIAGNOSIS — Z114 Encounter for screening for human immunodeficiency virus [HIV]: Secondary | ICD-10-CM

## 2014-12-26 DIAGNOSIS — Z0000 Encounter for general adult medical examination without abnormal findings: Secondary | ICD-10-CM

## 2014-12-26 DIAGNOSIS — I509 Heart failure, unspecified: Secondary | ICD-10-CM | POA: Insufficient documentation

## 2014-12-26 DIAGNOSIS — I249 Acute ischemic heart disease, unspecified: Secondary | ICD-10-CM

## 2014-12-26 DIAGNOSIS — E876 Hypokalemia: Secondary | ICD-10-CM | POA: Insufficient documentation

## 2014-12-26 DIAGNOSIS — I5021 Acute systolic (congestive) heart failure: Secondary | ICD-10-CM

## 2014-12-26 DIAGNOSIS — Z87891 Personal history of nicotine dependence: Secondary | ICD-10-CM | POA: Insufficient documentation

## 2014-12-26 HISTORY — DX: Hypokalemia: E87.6

## 2014-12-26 MED ORDER — ASPIRIN 81 MG PO CHEW: 81.0000 mg | CHEWABLE_TABLET | Freq: Every day | ORAL | Status: DC

## 2014-12-26 MED ORDER — TETANUS-DIPHTHERIA TOXOIDS TD 5-2 LFU IM INJ: 0.5000 mL | INJECTION | Freq: Once | INTRAMUSCULAR | Status: DC

## 2014-12-26 MED ORDER — CLOPIDOGREL BISULFATE 75 MG PO TABS: 75.0000 mg | ORAL_TABLET | Freq: Every day | ORAL | Status: DC

## 2014-12-26 MED ORDER — POTASSIUM CHLORIDE CRYS ER 20 MEQ PO TBCR: 20.0000 meq | EXTENDED_RELEASE_TABLET | Freq: Every day | ORAL | Status: DC

## 2014-12-26 MED ORDER — ATORVASTATIN CALCIUM 80 MG PO TABS: 80.0000 mg | ORAL_TABLET | Freq: Every day | ORAL | Status: DC

## 2014-12-26 MED ORDER — LISINOPRIL 5 MG PO TABS: 5.0000 mg | ORAL_TABLET | Freq: Every day | ORAL | Status: DC

## 2014-12-26 MED ORDER — TETANUS-DIPHTH-ACELL PERTUSSIS 5-2.5-18.5 LF-MCG/0.5 IM SUSP
0.5000 mL | Freq: Once | INTRAMUSCULAR | Status: AC
  Administered 2014-12-26: 0.5 mL via INTRAMUSCULAR

## 2014-12-26 MED ORDER — MAGNESIUM OXIDE 400 (241.3 MG) MG PO TABS: 400.0000 mg | ORAL_TABLET | Freq: Every day | ORAL | Status: DC

## 2014-12-26 MED ORDER — CARVEDILOL 12.5 MG PO TABS
12.5000 mg | ORAL_TABLET | Freq: Two times a day (BID) | ORAL | Status: AC
Start: 2014-12-26 — End: ?

## 2014-12-26 NOTE — Assessment & Plan Note (Signed)
Healthcare maintenance: tdap today Screening HIV today

## 2014-12-26 NOTE — Patient Instructions (Signed)
Ms. Oeser  Thank you for coming in today. It was a pleasure meeting you. I look forward to being your primary doctor.   1. CHF due to coronary artery disease- has resolved Continue current medications   2. Low potassium and mag- Resolved with supplement Take supplements once daily in morning   3. Healthcare maintenance: tdap today Screening HIV today  F/u in 1 month for pap smear  Dr. Armen Pickup

## 2014-12-26 NOTE — Assessment & Plan Note (Signed)
CHF due to coronary artery disease- has resolved Continue current medications

## 2014-12-26 NOTE — Assessment & Plan Note (Signed)
Low potassium and mag- Resolved with supplement Take supplements once daily in morning

## 2014-12-26 NOTE — Progress Notes (Signed)
Establishing Care Hx CHF Medication refills  No Tobacco, no ETOH user since June 2016

## 2014-12-26 NOTE — Progress Notes (Signed)
   Subjective:    Patient ID: Breanna Khan, female    DOB: 10/08/1965, 49 y.o.   MRN: [id number] CC; meet PCP, f/u CHF  HPI 49 yo F with recent ACS and acute systolic CHF. CHF has resolved following balloon angioplasty with improvement of EF from 15-20% to 50-60%.   1. CHF: doing well. Compliant with regimen. No CP, SOB or leg swelling. No longer smoking or drinking ETOH. Feels great.   2. Smoking: last cigarette when she had ACS last month. No cough or SOB. Feels confident that she can successfully quit.   3. Healthcare maintenance: amenable to screening HIV, has a negative test many years ago. Last pap 4 yrs ago. Amenable to Tdap.   Soc Hx: former smoker, quit x one month   Review of Systems  Constitutional: Negative for fever, chills and unexpected weight change.  Respiratory: Negative for shortness of breath.   Cardiovascular: Negative for chest pain and leg swelling.  Gastrointestinal: Negative for abdominal pain and blood in stool.  Skin: Negative for rash.  Psychiatric/Behavioral: Negative for suicidal ideas and dysphoric mood.       Objective:   Physical Exam BP 116/73 mmHg  Pulse 59  Temp(Src) 97.5 F (36.4 C) (Oral)  Resp 16  Ht 6\' 1"  (1.854 m)  Wt 196 lb (88.905 kg)  BMI 25.86 kg/m2  SpO2 100% General appearance: alert, cooperative and no distress Eyes: conjunctivae/corneas clear. PERRL, EOM's intact.  Ears: normal TM's and external ear canals both ears Nose: no discharge, turbinates pink, swollen Throat: lips, mucosa, and tongue normal; teeth and gums normal Lungs: clear to auscultation bilaterally Heart: regular rate and rhythm, S1, S2 normal, no murmur, click, rub or gallop Extremities: extremities normal, atraumatic, no cyanosis or edema       Assessment & Plan:

## 2014-12-27 LAB — HIV ANTIBODY (ROUTINE TESTING W REFLEX): HIV 1&2 Ab, 4th Generation: NONREACTIVE

## 2014-12-31 ENCOUNTER — Telehealth: Payer: Self-pay | Admitting: *Deleted

## 2014-12-31 NOTE — Telephone Encounter (Signed)
-----   Message from Dessa Phi, MD sent at 12/27/2014  9:02 AM EDT ----- Screening HIV negative

## 2015-01-01 ENCOUNTER — Telehealth: Payer: Self-pay | Admitting: *Deleted

## 2015-01-01 NOTE — Telephone Encounter (Signed)
-----   Message from Josalyn Funches, MD sent at 12/27/2014  9:02 AM EDT ----- Screening HIV negative 

## 2015-01-01 NOTE — Telephone Encounter (Signed)
Pt aware of results 

## 2015-01-09 ENCOUNTER — Other Ambulatory Visit (INDEPENDENT_AMBULATORY_CARE_PROVIDER_SITE_OTHER): Payer: No Typology Code available for payment source | Admitting: *Deleted

## 2015-01-09 DIAGNOSIS — E876 Hypokalemia: Secondary | ICD-10-CM

## 2015-01-09 DIAGNOSIS — I10 Essential (primary) hypertension: Secondary | ICD-10-CM

## 2015-01-09 LAB — BASIC METABOLIC PANEL
BUN: 11 mg/dL (ref 6–23)
CHLORIDE: 104 meq/L (ref 96–112)
CO2: 29 meq/L (ref 19–32)
CREATININE: 0.71 mg/dL (ref 0.40–1.20)
Calcium: 8.6 mg/dL (ref 8.4–10.5)
GFR: 112.6 mL/min (ref >60.00)
Glucose, Bld: 86 mg/dL (ref 70–99)
Potassium: 4.1 mEq/L (ref 3.5–5.1)
Sodium: 136 mEq/L (ref 135–145)

## 2015-01-09 LAB — HEPATIC FUNCTION PANEL
ALK PHOS: 47 U/L (ref 39–117)
ALT: 15 U/L (ref 0–35)
AST: 22 U/L (ref 0–37)
Albumin: 3.5 g/dL (ref 3.5–5.2)
Bilirubin, Direct: 0.2 mg/dL (ref 0.0–0.3)
TOTAL PROTEIN: 6.8 g/dL (ref 6.0–8.3)
Total Bilirubin: 0.8 mg/dL (ref 0.2–1.2)

## 2015-01-09 LAB — LIPID PANEL
CHOL/HDL RATIO: 3
Cholesterol: 84 mg/dL (ref 0–200)
HDL: 29.6 mg/dL — ABNORMAL LOW (ref >39.00)
LDL CALC: 48 mg/dL (ref 0–99)
NonHDL: 54.53
Triglycerides: 34 mg/dL (ref 0.0–149.0)
VLDL: 6.8 mg/dL (ref 0.0–40.0)

## 2015-01-09 NOTE — Addendum Note (Signed)
Addended by: BOWDEN, ROBIN K on: 01/09/2015 07:43 AM   Modules accepted: Orders  

## 2015-01-09 NOTE — Addendum Note (Signed)
Addended by: Tonita Phoenix on: 01/09/2015 07:43 AM   Modules accepted: Orders

## 2015-01-13 ENCOUNTER — Other Ambulatory Visit: Payer: Self-pay

## 2015-01-13 DIAGNOSIS — E785 Hyperlipidemia, unspecified: Secondary | ICD-10-CM

## 2015-02-06 ENCOUNTER — Other Ambulatory Visit: Payer: No Typology Code available for payment source | Admitting: Family Medicine

## 2015-03-23 ENCOUNTER — Emergency Department (HOSPITAL_COMMUNITY): Payer: No Typology Code available for payment source

## 2015-03-23 ENCOUNTER — Encounter (HOSPITAL_COMMUNITY): Payer: Self-pay | Admitting: *Deleted

## 2015-03-23 ENCOUNTER — Emergency Department (HOSPITAL_COMMUNITY)
Admission: EM | Admit: 2015-03-23 | Discharge: 2015-03-23 | Disposition: A | Discharge status: Home / Self Care | Payer: No Typology Code available for payment source | Attending: Emergency Medicine | Admitting: Emergency Medicine

## 2015-03-23 DIAGNOSIS — Z7902 Long term (current) use of antithrombotics/antiplatelets: Secondary | ICD-10-CM | POA: Insufficient documentation

## 2015-03-23 DIAGNOSIS — S8392XA Sprain of unspecified site of left knee, initial encounter: Secondary | ICD-10-CM

## 2015-03-23 DIAGNOSIS — I10 Essential (primary) hypertension: Secondary | ICD-10-CM | POA: Insufficient documentation

## 2015-03-23 DIAGNOSIS — Z7982 Long term (current) use of aspirin: Secondary | ICD-10-CM | POA: Insufficient documentation

## 2015-03-23 DIAGNOSIS — Y9389 Activity, other specified: Secondary | ICD-10-CM | POA: Insufficient documentation

## 2015-03-23 DIAGNOSIS — I252 Old myocardial infarction: Secondary | ICD-10-CM | POA: Insufficient documentation

## 2015-03-23 DIAGNOSIS — Y9289 Other specified places as the place of occurrence of the external cause: Secondary | ICD-10-CM | POA: Insufficient documentation

## 2015-03-23 DIAGNOSIS — X58XXXA Exposure to other specified factors, initial encounter: Secondary | ICD-10-CM | POA: Insufficient documentation

## 2015-03-23 DIAGNOSIS — Z87891 Personal history of nicotine dependence: Secondary | ICD-10-CM | POA: Insufficient documentation

## 2015-03-23 DIAGNOSIS — Z79899 Other long term (current) drug therapy: Secondary | ICD-10-CM | POA: Insufficient documentation

## 2015-03-23 DIAGNOSIS — Y998 Other external cause status: Secondary | ICD-10-CM | POA: Insufficient documentation

## 2015-03-23 DIAGNOSIS — I509 Heart failure, unspecified: Secondary | ICD-10-CM | POA: Insufficient documentation

## 2015-03-23 MED ORDER — HYDROCODONE-ACETAMINOPHEN 5-325 MG PO TABS
1.0000 | ORAL_TABLET | Freq: Once | ORAL | Status: AC
Start: 2015-03-23 — End: 2015-03-23
  Administered 2015-03-23: 1 via ORAL
  Filled 2015-03-23: qty 1

## 2015-03-23 MED ORDER — HYDROCODONE-ACETAMINOPHEN 5-325 MG PO TABS: 1.0000 | ORAL_TABLET | ORAL | Status: DC | PRN

## 2015-03-23 NOTE — Discharge Instructions (Signed)
How to Use a Knee Brace Call Dr. Eliberto Ivory office tomorrow to schedule next available appointment. Take Tylenol for mild pain or the pain medicine prescribed for bad pain. Don't take Tylenol together with the pain medicine prescribed as the combination comedonal dangerous to your liver. Your blood pressure rechecked within the next 3 weeks. Today's was elevated at 158/98. Use the knee immobilizer for comfort as needed. Place an ice pack over your knee 4 times daily for 30 minutes at a time, which will help with pain and swelling  A knee  brace is a device that you wear to support your knee, especially if the knee is healing after an injury or surgery. There are several types of knee braces. Some are designed to prevent an injury (prophylactic brace). These are often worn during sports. Others support an injured knee (functional brace) or keep it still while it heals (rehabilitative brace). People with severe arthritis of the knee may benefit from a brace that takes some pressure off the knee (unloader brace). Most knee braces are made from a combination of cloth and metal or plastic.  You may need to wear a knee brace to:  Relieve knee pain.  Help your knee support your weight (improve stability).  Help you walk farther (improve mobility).  Prevent injury.  Support your knee while it heals from surgery or from an injury. RISKS AND COMPLICATIONS Generally, knee braces are very safe to wear. However, problems may occur, including:  Skin irritation that may lead to infection.  Making your condition worse if you wear the brace in the wrong way. HOW TO USE A KNEE BRACE Different braces will have different instructions for use. Your health care provider will tell you or show you:  How to put on your brace.  How to adjust the brace.  When and how often to wear the brace.  How to remove the brace.  If you will need any assistive devices in addition to the brace, such as crutches or a  cane. In general, your brace should:  Have the hinge of the brace line up with the bend of your knee.  Have straps, hooks, or tapes that fasten snugly around your leg.  Not feel too tight or too loose. HOW TO CARE FOR A KNEE BRACE  Check your brace often for signs of damage, such as loose connections or attachments. Your knee brace may get damaged or wear out during normal use.  Wash the fabric parts of your brace with soap and water.  Read the insert that comes with your brace for other specific care instructions. SEEK MEDICAL CARE IF:  Your knee brace is too loose or too tight and you cannot adjust it.  Your knee brace causes skin redness, swelling, bruising, or irritation.  Your knee brace is not helping.  Your knee brace is making your knee pain worse.   This information is not intended to replace advice given to you by your health care provider. Make sure you discuss any questions you have with your health care provider.   Document Released: 08/21/2003 Document Revised: 02/19/2015 Document Reviewed: 09/23/2014 Elsevier Interactive Patient Education [employer]

## 2015-03-23 NOTE — ED Notes (Signed)
Pt states she was getting out of her tub and slipped d/t using baby oil.  Pt landed in tub, her L knee taking full impact.  Swelling noted.

## 2015-03-23 NOTE — ED Provider Notes (Addendum)
CSN: [id number]     Arrival date & time 03/23/15  3794 History   First MD Initiated Contact with Patient 03/23/15 [phone]     No chief complaint on file.  Chief complaint left knee pain  (Consider location/radiation/quality/duration/timing/severity/associated sxs/prior Treatment) Patient is a 49 y.o. female presenting with knee pain.  Knee Pain  Complains of left knee pain after she fell on a flexed knee while in the bathtub 4 days ago. She did not have pain or swelling until the following day. She presents today with left anterior knee pain and swelling, worse with walking. Improved with rest. No treatment prior to coming here. States pain is mild at present no other injury. No other associated symptoms. No treatment prior to coming here. Past Medical History  Diagnosis Date  . Myocardial infarction June 15,20116  . Hypertension Dx June 2016  . CHF (congestive heart failure) November 27 2014   Past Surgical History  Procedure Laterality Date  . Abdominal hysterectomy    . Tubal ligation    . Cardiac catheterization N/A 11/27/2014    Procedure: Left Heart Cath and Coronary Angiography;  Surgeon: Lyn Records, MD;  Location: [facility] INVASIVE CV LAB;  Service: Cardiovascular;  Laterality: N/A;  . Cardiac catheterization N/A 11/27/2014    Procedure: Coronary Balloon Angioplasty;  Surgeon: Lyn Records, MD;  Location: [facility] INVASIVE CV LAB;  Service: Cardiovascular;  Laterality: N/A;   Family History  Problem Relation Age of Onset  . Heart failure Mother   . Canavan disease Father   . Cancer Father   . Heart failure Brother    Social History  Substance Use Topics  . Smoking status: Former Smoker -- 0.50 packs/day for 20 years    Types: Cigarettes    Quit date: 11/25/2014  . Smokeless tobacco: Never Used  . Alcohol Use: No     Comment: socially   OB History    No data available     Review of Systems  Constitutional: Negative.   Musculoskeletal: Positive for arthralgias.       Left knee  pain      Allergies  Review of patient's allergies indicates no known allergies.  Home Medications   Prior to Admission medications   Medication Sig Start Date End Date Taking? Authorizing Provider  aspirin 81 MG chewable tablet Chew 1 tablet (81 mg total) by mouth daily. 12/26/14   Josalyn Funches, MD  atorvastatin (LIPITOR) 80 MG tablet Take 1 tablet (80 mg total) by mouth daily at 6 PM. 12/26/14   Dessa Phi, MD  carvedilol (COREG) 12.5 MG tablet Take 1 tablet (12.5 mg total) by mouth 2 (two) times daily with a meal. 12/26/14   Dessa Phi, MD  clopidogrel (PLAVIX) 75 MG tablet Take 1 tablet (75 mg total) by mouth daily with breakfast. 12/26/14   Dessa Phi, MD  lisinopril (PRINIVIL,ZESTRIL) 5 MG tablet Take 1 tablet (5 mg total) by mouth daily. 12/26/14   Josalyn Funches, MD  magnesium oxide (MAG-OX) 400 (241.3 MG) MG tablet Take 1 tablet (400 mg total) by mouth daily. 12/26/14   Josalyn Funches, MD  potassium chloride SA (K-DUR,KLOR-CON) 20 MEQ tablet Take 1 tablet (20 mEq total) by mouth daily. 12/26/14   Josalyn Funches, MD   There were no vitals taken for this visit. Physical Exam  Constitutional: She appears well-developed and well-nourished.  HENT:  Head: Normocephalic and atraumatic.  Eyes: EOM are normal.  Neck: Normal range of motion. Neck supple.  Cardiovascular:  Mildly  bradycardic  Abdominal: She exhibits no distension.  Musculoskeletal:  Left lower extremity swollen and anterior knee, with effusion. Mildly tender overlying superior aspect of patella. No tenderness of fibular head. Negative Lachman sign negative posterior drawer sign and no collateral weakness. DP pulse 2+. Good capillary refill. All other extremities are contusion abrasion or tenderness neurovascular intact    ED Course  Procedures (including critical care time) Labs Review Labs Reviewed - No data to display  Imaging Review No results found. I have personally reviewed and evaluated  these images and lab results as part of my medical decision-making.   EKG Interpretation None     Declines pain medicine 10:05 AM requesting pain medicine. Norco order. X-ray viewed by me. Results for orders placed or performed in visit on 01/09/15  Basic metabolic panel  Result Value Ref Range   Sodium 136 135 - 145 mEq/L   Potassium 4.1 3.5 - 5.1 mEq/L   Chloride 104 96 - 112 mEq/L   CO2 29 19 - 32 mEq/L   Glucose, Bld 86 70 - 99 mg/dL   BUN 11 6 - 23 mg/dL   Creatinine, Ser 8.11 0.40 - 1.20 mg/dL   Calcium 8.6 8.4 - 91.4 mg/dL   GFR 782.95 >62.13 mL/min  Lipid panel  Result Value Ref Range   Cholesterol 84 0 - 200 mg/dL   Triglycerides 08.6 0.0 - 149.0 mg/dL   HDL 57.84 (L) >69.62 mg/dL   VLDL 6.8 0.0 - 95.2 mg/dL   LDL Cholesterol 48 0 - 99 mg/dL   Total CHOL/HDL Ratio 3    NonHDL 54.53   Hepatic function panel  Result Value Ref Range   Total Bilirubin 0.8 0.2 - 1.2 mg/dL   Bilirubin, Direct 0.2 0.0 - 0.3 mg/dL   Alkaline Phosphatase 47 39 - 117 U/L   AST 22 0 - 37 U/L   ALT 15 0 - 35 U/L   Total Protein 6.8 6.0 - 8.3 g/dL   Albumin 3.5 3.5 - 5.2 g/dL   Dg Knee Complete 4 Views Left  03/23/2015   CLINICAL DATA:  Fall 4 days ago with lateral left knee pain and swelling. Initial encounter.  EXAM: LEFT KNEE - COMPLETE 4+ VIEW  COMPARISON:  None.  FINDINGS: No fracture or dislocation identified. There is a suprapatellar joint effusion. Moderate proliferative changes are seen involving medial and lateral joint spaces with some associated mild joint space narrowing. Proliferative changes are also seen involving the patellofemoral joint. No bony lesions.  IMPRESSION: No acute fracture identified. Moderate tricompartmental degenerative disease. Associated suprapatellar joint effusion.   Electronically Signed   By: Irish Lack M.D.   On: 03/23/2015 09:22     MDM  Plan knee immobilizer prescription Norco. Referral Dr. Magnus Ivan, orthopedics Blood Pressure recheck 3  weeks Diagnosis #1 sprain of left knee #2 elevated blood pressure Final diagnoses:  None        Doug Sou, MD 03/23/15 1009  Doug Sou, MD 03/23/15 1010

## 2015-03-23 NOTE — ED Notes (Signed)
Pt able to ambulate with some difficulty.

## 2015-03-27 ENCOUNTER — Emergency Department (HOSPITAL_COMMUNITY)
Admission: EM | Admit: 2015-03-27 | Discharge: 2015-03-27 | Disposition: A | Discharge status: Home / Self Care | Payer: Self-pay | Attending: Emergency Medicine | Admitting: Emergency Medicine

## 2015-03-27 ENCOUNTER — Emergency Department (HOSPITAL_COMMUNITY): Payer: Self-pay

## 2015-03-27 ENCOUNTER — Encounter (HOSPITAL_COMMUNITY): Payer: Self-pay | Admitting: *Deleted

## 2015-03-27 ENCOUNTER — Emergency Department (HOSPITAL_COMMUNITY): Payer: No Typology Code available for payment source

## 2015-03-27 DIAGNOSIS — R200 Anesthesia of skin: Secondary | ICD-10-CM

## 2015-03-27 DIAGNOSIS — Z7982 Long term (current) use of aspirin: Secondary | ICD-10-CM | POA: Insufficient documentation

## 2015-03-27 DIAGNOSIS — M545 Low back pain: Secondary | ICD-10-CM | POA: Insufficient documentation

## 2015-03-27 DIAGNOSIS — Z79899 Other long term (current) drug therapy: Secondary | ICD-10-CM | POA: Insufficient documentation

## 2015-03-27 DIAGNOSIS — I10 Essential (primary) hypertension: Secondary | ICD-10-CM | POA: Insufficient documentation

## 2015-03-27 DIAGNOSIS — R0789 Other chest pain: Secondary | ICD-10-CM

## 2015-03-27 DIAGNOSIS — I509 Heart failure, unspecified: Secondary | ICD-10-CM | POA: Insufficient documentation

## 2015-03-27 DIAGNOSIS — I252 Old myocardial infarction: Secondary | ICD-10-CM | POA: Insufficient documentation

## 2015-03-27 DIAGNOSIS — Z7902 Long term (current) use of antithrombotics/antiplatelets: Secondary | ICD-10-CM | POA: Insufficient documentation

## 2015-03-27 DIAGNOSIS — R079 Chest pain, unspecified: Secondary | ICD-10-CM | POA: Insufficient documentation

## 2015-03-27 DIAGNOSIS — R202 Paresthesia of skin: Secondary | ICD-10-CM

## 2015-03-27 DIAGNOSIS — Z720 Tobacco use: Secondary | ICD-10-CM | POA: Insufficient documentation

## 2015-03-27 LAB — BASIC METABOLIC PANEL
Anion gap: 8 (ref 5–15)
BUN: 9 mg/dL (ref 6–20)
CHLORIDE: 104 mmol/L (ref 101–111)
CO2: 28 mmol/L (ref 22–32)
Calcium: 9.1 mg/dL (ref 8.9–10.3)
Creatinine, Ser: 0.73 mg/dL (ref 0.44–1.00)
GFR calc Af Amer: >60 mL/min (ref >60)
GLUCOSE: 111 mg/dL — AB (ref 65–99)
POTASSIUM: 3.8 mmol/L (ref 3.5–5.1)
Sodium: 140 mmol/L (ref 135–145)

## 2015-03-27 LAB — CBC
HEMATOCRIT: 48 % — AB (ref 36.0–46.0)
Hemoglobin: 16.1 g/dL — ABNORMAL HIGH (ref 12.0–15.0)
MCH: 32.5 pg (ref 26.0–34.0)
MCHC: 33.5 g/dL (ref 30.0–36.0)
MCV: 96.8 fL (ref 78.0–100.0)
PLATELETS: 311 10*3/uL (ref 150–400)
RBC: 4.96 MIL/uL (ref 3.87–5.11)
RDW: 15.2 % (ref 11.5–15.5)
WBC: 5.9 10*3/uL (ref 4.0–10.5)

## 2015-03-27 LAB — I-STAT TROPONIN, ED
TROPONIN I, POC: 0 ng/mL (ref 0.00–0.08)
Troponin i, poc: 0 ng/mL (ref 0.00–0.08)

## 2015-03-27 MED ORDER — DIAZEPAM 5 MG PO TABS
5.0000 mg | ORAL_TABLET | Freq: Once | ORAL | Status: AC
  Administered 2015-03-27: 5 mg via ORAL
  Filled 2015-03-27: qty 1

## 2015-03-27 MED ORDER — DIAZEPAM 5 MG PO TABS: 5.0000 mg | ORAL_TABLET | Freq: Two times a day (BID) | ORAL | Status: DC | PRN

## 2015-03-27 NOTE — ED Notes (Signed)
Pt adds that she has upper left area back pain and states the tingling sensation to left arm started in fingers and worked its way up into the left upper shoulder/chest area.

## 2015-03-27 NOTE — ED Notes (Addendum)
Pt is here with left arm tingling for 2 days.  No chest pain.  Pt states she had stent placed in July. BP 150/94.

## 2015-03-27 NOTE — ED Notes (Signed)
Pt remains in MRI 

## 2015-03-27 NOTE — ED Notes (Signed)
Pt back from MRI 

## 2015-03-27 NOTE — ED Notes (Signed)
Bigeminy on arrival

## 2015-03-27 NOTE — ED Provider Notes (Signed)
CSN: [id number]     Arrival date & time 03/27/15  1030 History   First MD Initiated Contact with Patient 03/27/15 1035     Chief Complaint  Patient presents with  . Tingling    left arm  . Back Pain     (Consider location/radiation/quality/duration/timing/severity/associated sxs/prior Treatment) Patient is a 49 y.o. female presenting with back pain and neurologic complaint. The history is provided by the patient.  Back Pain Associated symptoms: no abdominal pain, no chest pain and no fever   Neurologic Problem This is a new problem. The current episode started 2 days ago. The problem occurs constantly. The problem has not changed since onset.Pertinent negatives include no chest pain, no abdominal pain and no shortness of breath. Nothing aggravates the symptoms. Nothing relieves the symptoms. She has tried nothing for the symptoms.    Past Medical History  Diagnosis Date  . Myocardial infarction [facility]) June 15,20116  . Hypertension Dx June 2016  . CHF (congestive heart failure) [facility]) November 27 2014   Past Surgical History  Procedure Laterality Date  . Abdominal hysterectomy    . Tubal ligation    . Cardiac catheterization N/A 11/27/2014    Procedure: Left Heart Cath and Coronary Angiography;  Surgeon: Lyn Records, MD;  Location: [facility] INVASIVE CV LAB;  Service: Cardiovascular;  Laterality: N/A;  . Cardiac catheterization N/A 11/27/2014    Procedure: Coronary Balloon Angioplasty;  Surgeon: Lyn Records, MD;  Location: [facility] INVASIVE CV LAB;  Service: Cardiovascular;  Laterality: N/A;   Family History  Problem Relation Age of Onset  . Heart failure Mother   . Canavan disease Father   . Cancer Father   . Heart failure Brother    Social History  Substance Use Topics  . Smoking status: Current Every Day Smoker -- 0.50 packs/day for 20 years    Types: Cigarettes    Last Attempt to Quit: 11/25/2014  . Smokeless tobacco: Never Used  . Alcohol Use: 0.0 oz/week    0 Standard drinks or  equivalent per week     Comment: socially   OB History    No data available     Review of Systems  Constitutional: Negative for fever.  Respiratory: Negative for cough and shortness of breath.   Cardiovascular: Negative for chest pain.  Gastrointestinal: Negative for abdominal pain.  Musculoskeletal: Positive for back pain.  All other systems reviewed and are negative.     Allergies  Review of patient's allergies indicates no known allergies.  Home Medications   Prior to Admission medications   Medication Sig Start Date End Date Taking? Authorizing Provider  aspirin 81 MG chewable tablet Chew 1 tablet (81 mg total) by mouth daily. 12/26/14   Josalyn Funches, MD  atorvastatin (LIPITOR) 80 MG tablet Take 1 tablet (80 mg total) by mouth daily at 6 PM. 12/26/14   Dessa Phi, MD  carvedilol (COREG) 12.5 MG tablet Take 1 tablet (12.5 mg total) by mouth 2 (two) times daily with a meal. 12/26/14   Dessa Phi, MD  clopidogrel (PLAVIX) 75 MG tablet Take 1 tablet (75 mg total) by mouth daily with breakfast. 12/26/14   Dessa Phi, MD  HYDROcodone-acetaminophen (NORCO) 5-325 MG tablet Take 1-2 tablets by mouth every 4 (four) hours as needed for moderate pain or severe pain. 03/23/15   Doug Sou, MD  lisinopril (PRINIVIL,ZESTRIL) 5 MG tablet Take 1 tablet (5 mg total) by mouth daily. 12/26/14   Josalyn Funches, MD  magnesium oxide (MAG-OX) 400 (  241.3 MG) MG tablet Take 1 tablet (400 mg total) by mouth daily. 12/26/14   Josalyn Funches, MD  potassium chloride SA (K-DUR,KLOR-CON) 20 MEQ tablet Take 1 tablet (20 mEq total) by mouth daily. 12/26/14   Josalyn Funches, MD   BP 157/62 mmHg  Pulse 93  Temp(Src) 97.9 F (36.6 C) (Oral)  Resp 18  SpO2 98% Physical Exam  Constitutional: She is oriented to person, place, and time. She appears well-developed and well-nourished. No distress.  HENT:  Head: Normocephalic and atraumatic.  Mouth/Throat: Oropharynx is clear and moist.  Eyes:  EOM are normal. Pupils are equal, round, and reactive to light.  Neck: Normal range of motion. Neck supple.  Cardiovascular: Normal rate and regular rhythm.  Exam reveals no friction rub.   No murmur heard. Pulmonary/Chest: Effort normal and breath sounds normal. No respiratory distress. She has no wheezes. She has no rales.  Abdominal: Soft. She exhibits no distension. There is no tenderness. There is no rebound.  Musculoskeletal: Normal range of motion. She exhibits no edema.       Arms: Neurological: She is alert and oriented to person, place, and time. A sensory deficit (mild altered light touch in L arm) is present. No cranial nerve deficit. She exhibits normal muscle tone. GCS eye subscore is 4. GCS verbal subscore is 5. GCS motor subscore is 6.  Skin: She is not diaphoretic.  Nursing note and vitals reviewed.   ED Course  Procedures (including critical care time) Labs Review Labs Reviewed  CBC - Abnormal; Notable for the following:    Hemoglobin 16.1 (*)    HCT 48.0 (*)    All other components within normal limits  BASIC METABOLIC PANEL    Imaging Review Dg Chest 2 View  03/27/2015  CLINICAL DATA:  Upper left back pain.  Tingling sensation. EXAM: CHEST  2 VIEW COMPARISON:  11/27/2014 FINDINGS: The heart size and mediastinal contours are within normal limits. Both lungs are clear. The visualized skeletal structures are unremarkable. IMPRESSION: No active cardiopulmonary disease. Electronically Signed   By: Elige Ko   On: 03/27/2015 11:20   Mr Brain Wo Contrast  03/27/2015  CLINICAL DATA:  Left arm tingling for 2 days. Left upper back pain. Tingling sensation begin in the fingers and has in proximal to the level of the shoulder. Coronary stent placement 3 months ago. EXAM: MRI HEAD WITHOUT CONTRAST TECHNIQUE: Multiplanar, multiecho pulse sequences of the brain and surrounding structures were obtained without intravenous contrast. COMPARISON:  None. FINDINGS: The  diffusion-weighted images demonstrate no evidence for acute or subacute infarction. No acute hemorrhage or mass lesion is present. The ventricles are of normal size. No significant extraaxial fluid collection is present. Three or 4 subcortical T2 hyperintensities are within normal limits for age. There is no significant white matter disease otherwise. The internal auditory canals are within normal limits bilaterally. The brainstem is within normal limits. Flow is present in the major intracranial arteries. The globes and orbits are intact. Mild mucosal thickening is present in the inferior maxillary sinuses bilaterally. There is some fluid on the right. A right mastoid effusion is present. No obstructing nasopharyngeal lesion is present. 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 The skullbase is within normal limits. Midline structures are unremarkable. IMPRESSION: 1. No acute or focal lesion to explain the patient's left upper extremity symptoms. 2. Right greater than left maxillary sinus disease. 3. Right mastoid effusion. No obstructing nasopharyngeal lesion is evident. Electronically Signed   By: Virl Son.D.  On: 03/27/2015 15:29   I have personally reviewed and evaluated these images and lab results as part of my medical decision-making.   EKG Interpretation   Date/Time:  Thursday March 27 2015 10:39:55 EDT Ventricular Rate:  114 PR Interval:  151 QRS Duration: 87 QT Interval:  383 QTC Calculation: 527 R Axis:   69 Text Interpretation:  Sinus tachycardia Ventricular bigeminy Biatrial  enlargement Probable anterior infarct, age indeterminate Abnormal T,  consider ischemia, inferior leads T wave changes seen previously Increased  PVCs Confirmed by Gwendolyn Grant  MD, Treyven Lafauci ([id number]) on 03/27/2015 10:46:12 AM      MDM   Final diagnoses:  Chest tightness  Numbness and tingling in left arm    49 year old female here with left arm tingling. Present for 2 days. She was seen 4 days ago for knee  injury after falling. She called her orthopedic follow-up and told them about the arm tingling and they sent her to the ER for concern of stroke. She did not go to a doctor's appointment this morning. She describes a tingling sensation that began in her left hand and traveled up her left arm to her left posterior shoulder and mildly to her left chest. She denies any headache, vision changes, shortness breath, chest pain, nausea, vomiting. Here she has good strength in bilateral arms. She has mild altered light touch. She has some tenderness in her left trapezius muscle and in her left chest wall. I'm concerned this could be muscle spasm. She uses her arms for work but does not remember any specific injury. Prior head for possible stroke and check labs including troponin since she's having some chest tightness. EKG shows some PVCs but no new T wave changes when compared to prior.  MR ok. Symptoms abated after muscle relaxer. Given Rx for valium and serial troponins were negative. Stable for discharge.    Elwin Mocha, MD 03/27/15 1620

## 2017-02-01 ENCOUNTER — Emergency Department (HOSPITAL_COMMUNITY)
Admission: EM | Admit: 2017-02-01 | Discharge: 2017-02-01 | Disposition: A | Discharge status: Home / Self Care | Payer: No Typology Code available for payment source | Attending: Emergency Medicine | Admitting: Emergency Medicine

## 2017-02-01 ENCOUNTER — Emergency Department (HOSPITAL_COMMUNITY): Payer: No Typology Code available for payment source

## 2017-02-01 ENCOUNTER — Other Ambulatory Visit: Payer: Self-pay

## 2017-02-01 ENCOUNTER — Encounter (HOSPITAL_COMMUNITY): Payer: Self-pay | Admitting: Emergency Medicine

## 2017-02-01 DIAGNOSIS — T50901A Poisoning by unspecified drugs, medicaments and biological substances, accidental (unintentional), initial encounter: Secondary | ICD-10-CM | POA: Insufficient documentation

## 2017-02-01 DIAGNOSIS — I252 Old myocardial infarction: Secondary | ICD-10-CM | POA: Insufficient documentation

## 2017-02-01 DIAGNOSIS — Y639 Failure in dosage during unspecified surgical and medical care: Secondary | ICD-10-CM | POA: Insufficient documentation

## 2017-02-01 DIAGNOSIS — I110 Hypertensive heart disease with heart failure: Secondary | ICD-10-CM | POA: Insufficient documentation

## 2017-02-01 DIAGNOSIS — I509 Heart failure, unspecified: Secondary | ICD-10-CM | POA: Insufficient documentation

## 2017-02-01 DIAGNOSIS — Z7982 Long term (current) use of aspirin: Secondary | ICD-10-CM | POA: Insufficient documentation

## 2017-02-01 DIAGNOSIS — Z7902 Long term (current) use of antithrombotics/antiplatelets: Secondary | ICD-10-CM | POA: Insufficient documentation

## 2017-02-01 DIAGNOSIS — Z79899 Other long term (current) drug therapy: Secondary | ICD-10-CM | POA: Insufficient documentation

## 2017-02-01 DIAGNOSIS — F17210 Nicotine dependence, cigarettes, uncomplicated: Secondary | ICD-10-CM | POA: Insufficient documentation

## 2017-02-01 LAB — CBC WITH DIFFERENTIAL/PLATELET
Basophils Absolute: 0 10*3/uL (ref 0.0–0.1)
Basophils Relative: 0 %
Eosinophils Absolute: 0.1 10*3/uL (ref 0.0–0.7)
Eosinophils Relative: 1 %
HCT: 40.5 % (ref 36.0–46.0)
Hemoglobin: 13.8 g/dL (ref 12.0–15.0)
Lymphocytes Relative: 26 %
Lymphs Abs: 2.4 10*3/uL (ref 0.7–4.0)
MCH: 30.9 pg (ref 26.0–34.0)
MCHC: 34.1 g/dL (ref 30.0–36.0)
MCV: 90.8 fL (ref 78.0–100.0)
Monocytes Absolute: 0.5 10*3/uL (ref 0.1–1.0)
Monocytes Relative: 5 %
Neutro Abs: 6.5 10*3/uL (ref 1.7–7.7)
Neutrophils Relative %: 68 %
Platelets: 266 10*3/uL (ref 150–400)
RBC: 4.46 MIL/uL (ref 3.87–5.11)
RDW: 13.4 % (ref 11.5–15.5)
WBC: 9.4 10*3/uL (ref 4.0–10.5)

## 2017-02-01 LAB — URINALYSIS, ROUTINE W REFLEX MICROSCOPIC
Bilirubin Urine: NEGATIVE
Glucose, UA: NEGATIVE mg/dL
Hgb urine dipstick: NEGATIVE
Ketones, ur: NEGATIVE mg/dL
Leukocytes, UA: NEGATIVE
Nitrite: NEGATIVE
Protein, ur: 100 mg/dL — AB
Specific Gravity, Urine: 1.011 (ref 1.005–1.030)
pH: 6 (ref 5.0–8.0)

## 2017-02-01 LAB — RAPID URINE DRUG SCREEN, HOSP PERFORMED
Amphetamines: NOT DETECTED
Barbiturates: NOT DETECTED
Benzodiazepines: NOT DETECTED
Cocaine: NOT DETECTED
Opiates: POSITIVE — AB
Tetrahydrocannabinol: POSITIVE — AB

## 2017-02-01 LAB — BASIC METABOLIC PANEL
Anion gap: 9 (ref 5–15)
BUN: 9 mg/dL (ref 6–20)
CO2: 24 mmol/L (ref 22–32)
Calcium: 8.6 mg/dL — ABNORMAL LOW (ref 8.9–10.3)
Chloride: 107 mmol/L (ref 101–111)
Creatinine, Ser: 0.66 mg/dL (ref 0.44–1.00)
GFR calc Af Amer: >60 mL/min (ref >60)
GFR calc non Af Amer: >60 mL/min (ref >60)
Glucose, Bld: 112 mg/dL — ABNORMAL HIGH (ref 65–99)
Potassium: 3.3 mmol/L — ABNORMAL LOW (ref 3.5–5.1)
Sodium: 140 mmol/L (ref 135–145)

## 2017-02-01 LAB — TROPONIN I: Troponin I: <0.03 ng/mL (ref <0.03)

## 2017-02-01 MED ORDER — SODIUM CHLORIDE 0.9 % IV SOLN
INTRAVENOUS | Status: DC
  Administered 2017-02-01 (×2): via INTRAVENOUS

## 2017-02-01 NOTE — ED Triage Notes (Signed)
Pt in from home via [facility] with suspected Heroin overdose. Per EMS, pt was being bagged on their arrival, never lost pulses. PTA was given 4 of Narcan IN, pt became awake, responsive. Also c/o central cp, EMS reported pt's friends began CPR before their arrival. Hx of stent placement, CBG 151, BP 131/98, 95% RA, airway intact on arrival. 18G LWrist

## 2017-02-21 NOTE — ED Provider Notes (Signed)
[facility]-EMERGENCY DEPT Provider Note   CSN: [id number] Arrival date & time: 02/01/17  1826     History   Chief Complaint Chief Complaint  Patient presents with  . Drug Overdose    HPI Breanna Khan is a 51 y.o. female.  HPI   51 year old female with unintentional overdose? Patient states she was drinking with some friends in another apartment, but denies drug use. EMS was called to residence because patient was poorly responsive. On their arrival she was unresponsive very shallow respirations but a pulse. He was given Narcan intranasally with prompt clinical response.She's complaining of chest pain, but had bystander chest compressions. She has no acute complaints otherwise.  Past Medical History:  Diagnosis Date  . CHF (congestive heart failure) [facility]) November 27 2014  . Hypertension Dx June 2016  . Myocardial infarction [facility]) June 15,20116    Patient Active Problem List   Diagnosis Date Noted  . Hypokalemia 12/26/2014  . Healthcare maintenance 12/26/2014  . Hypertension 12/04/2014  . Acute coronary syndrome (HCC) 11/27/2014  . Smokes 1/2 pack per day 11/27/2014  . 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 Accelerated essential hypertension 11/27/2014  . Acute systolic HF (heart failure) (HCC) 11/27/2014    Past Surgical History:  Procedure Laterality Date  . ABDOMINAL HYSTERECTOMY    . CARDIAC CATHETERIZATION N/A 11/27/2014   Procedure: Left Heart Cath and Coronary Angiography;  Surgeon: Lyn Records, MD;  Location: [facility] INVASIVE CV LAB;  Service: Cardiovascular;  Laterality: N/A;  . CARDIAC CATHETERIZATION N/A 11/27/2014   Procedure: Coronary Balloon Angioplasty;  Surgeon: Lyn Records, MD;  Location: [facility] INVASIVE CV LAB;  Service: Cardiovascular;  Laterality: N/A;  . TUBAL LIGATION      OB History    No data available       Home Medications    Prior to Admission medications   Medication Sig Start Date End Date Taking? Authorizing Provider  aspirin 81 MG chewable  tablet Chew 1 tablet (81 mg total) by mouth daily. Patient not taking: Reported on 03/27/2015 12/26/14   Dessa Phi, MD  atorvastatin (LIPITOR) 80 MG tablet Take 1 tablet (80 mg total) by mouth daily at 6 PM. Patient not taking: Reported on 03/27/2015 12/26/14   Dessa Phi, MD  carvedilol (COREG) 12.5 MG tablet Take 1 tablet (12.5 mg total) by mouth 2 (two) times daily with a meal. Patient not taking: Reported on 03/27/2015 12/26/14   Dessa Phi, MD  clopidogrel (PLAVIX) 75 MG tablet Take 1 tablet (75 mg total) by mouth daily with breakfast. Patient not taking: Reported on 03/27/2015 12/26/14   Dessa Phi, MD  lisinopril (PRINIVIL,ZESTRIL) 5 MG tablet Take 1 tablet (5 mg total) by mouth daily. Patient not taking: Reported on 03/27/2015 12/26/14   Dessa Phi, MD  magnesium oxide (MAG-OX) 400 (241.3 MG) MG tablet Take 1 tablet (400 mg total) by mouth daily. Patient not taking: Reported on 03/27/2015 12/26/14   Dessa Phi, MD    Family History Family History  Problem Relation Age of Onset  . Heart failure Mother   . Canavan disease Father   . Cancer Father   . Heart failure Brother     Social History Social History  Substance Use Topics  . Smoking status: Current Every Day Smoker    Packs/day: 0.50    Years: 20.00    Types: Cigarettes    Last attempt to quit: 11/25/2014  . Smokeless tobacco: Never Used  . Alcohol use 0.0 oz/week  Comment: socially     Allergies   Patient has no known allergies.   Review of Systems Review of Systems  All systems reviewed and negative, other than as noted in HPI.  Physical Exam Updated Vital Signs BP 118/73   Pulse 79   Temp 97.9 F (36.6 C) (Oral)   Resp 14   Ht  (1.702 m)   Wt 83.9 kg (185 lb)   SpO2 100%   BMI 28.98 kg/m   Physical Exam  Constitutional: She is oriented to person, place, and time. She appears well-developed and well-nourished. No distress.  HENT:  Head: Normocephalic and  atraumatic.  Eyes: Conjunctivae are normal. Right eye exhibits no discharge. Left eye exhibits no discharge.  Neck: Neck supple.  Cardiovascular: Normal rate, regular rhythm and normal heart sounds.  Exam reveals no gallop and no friction rub.   No murmur heard. Pulmonary/Chest: Effort normal and breath sounds normal. No respiratory distress. She exhibits tenderness.  Abdominal: Soft. She exhibits no distension. There is no tenderness.  Musculoskeletal: She exhibits no edema or tenderness.  Neurological: She is alert and oriented to person, place, and time. No cranial nerve deficit. She exhibits normal muscle tone. Coordination normal.  Skin: Skin is warm and dry.  Psychiatric: She has a normal mood and affect. Her behavior is normal. Thought content normal.  Nursing note and vitals reviewed.    ED Treatments / Results  Labs (all labs ordered are listed, but only abnormal results are displayed) Labs Reviewed  BASIC METABOLIC PANEL - Abnormal; Notable for the following:       Result Value   Potassium 3.3 (*)    Glucose, Bld 112 (*)    Calcium 8.6 (*)    All other components within normal limits  URINALYSIS, ROUTINE W REFLEX MICROSCOPIC - Abnormal; Notable for the following:    Protein, ur 100 (*)    Bacteria, UA RARE (*)    Squamous Epithelial / LPF 0-5 (*)    All other components within normal limits  RAPID URINE DRUG SCREEN, HOSP PERFORMED - Abnormal; Notable for the following:    Opiates POSITIVE (*)    Tetrahydrocannabinol POSITIVE (*)    All other components within normal limits  CBC WITH DIFFERENTIAL/PLATELET  TROPONIN I    EKG  EKG Interpretation  Date/Time:  Tuesday February 01 2017 18:25:39 EDT Ventricular Rate:  80 PR Interval:  208 QRS Duration: 98 QT Interval:  412 QTC Calculation: 475 R Axis:   66 Text Interpretation:  Normal sinus rhythm Nonspecific T wave abnormality Prolonged QT Abnormal ECG Confirmed by Raeford Razor [phone]) on 02/01/2017 8:42:22 PM Also  confirmed by Raeford Razor [phone]), editor Elita Quick ([id number])  on 02/02/2017 7:15:24 AM       Radiology No results found.  Procedures Procedures (including critical care time)  Medications Ordered in ED Medications - No data to display   Initial Impression / Assessment and Plan / ED Course  I have reviewed the triage vital signs and the nursing notes.  Pertinent labs & imaging results that were available during my care of the patient were reviewed by me and considered in my medical decision making (see chart for details).     51 year old female presented with unresponsiveness. I suspect she overdosed on opiates, although she denies. Her UDS is positive. She had a brisk clinical response with Narcan. She has been observed since remained stable. She's complaining some chest pain but she did have bystander CPR. No actual documented loss  of pulses though. Her chest pain is very reproducible on palpation. It has been determined that no acute conditions requiring further emergency intervention are present at this time. The patient has been advised of the diagnosis and plan. I reviewed any labs and imaging including any potential incidental findings. We have discussed signs and symptoms that warrant return to the ED and they are listed in the discharge instructions.    Final Clinical Impressions(s) / ED Diagnoses   Final diagnoses:  Accidental drug overdose, initial encounter    New Prescriptions Discharge Medication List as of 02/01/2017 10:16 PM       Raeford Razor, MD 02/21/17 1038

## 2017-09-04 ENCOUNTER — Emergency Department (HOSPITAL_COMMUNITY): Payer: Self-pay

## 2017-09-04 ENCOUNTER — Encounter (HOSPITAL_COMMUNITY): Payer: Self-pay

## 2017-09-04 ENCOUNTER — Other Ambulatory Visit: Payer: Self-pay

## 2017-09-04 ENCOUNTER — Emergency Department (HOSPITAL_COMMUNITY)
Admission: EM | Admit: 2017-09-04 | Discharge: 2017-09-04 | Disposition: A | Discharge status: Home / Self Care | Payer: Self-pay | Attending: Emergency Medicine | Admitting: Emergency Medicine

## 2017-09-04 DIAGNOSIS — F17210 Nicotine dependence, cigarettes, uncomplicated: Secondary | ICD-10-CM | POA: Insufficient documentation

## 2017-09-04 DIAGNOSIS — I5021 Acute systolic (congestive) heart failure: Secondary | ICD-10-CM | POA: Insufficient documentation

## 2017-09-04 DIAGNOSIS — M25562 Pain in left knee: Secondary | ICD-10-CM | POA: Insufficient documentation

## 2017-09-04 DIAGNOSIS — I110 Hypertensive heart disease with heart failure: Secondary | ICD-10-CM | POA: Insufficient documentation

## 2017-09-04 MED ORDER — KETOROLAC TROMETHAMINE 60 MG/2ML IM SOLN
60.0000 mg | Freq: Once | INTRAMUSCULAR | Status: AC
  Administered 2017-09-04: 60 mg via INTRAMUSCULAR
  Filled 2017-09-04: qty 2

## 2017-09-04 NOTE — Discharge Instructions (Addendum)
You can take Tylenol or Ibuprofen as directed for pain. You can alternate Tylenol and Ibuprofen every 4 hours. If you take Tylenol at 1pm, then you can take Ibuprofen at 5pm. Then you can take Tylenol again at 9pm.   Do not take more than 4000 mg of tylenol a day. Do not take more than 600 mg twice a day of ibuprofen.   Follow the RICE (Rest, Ice, Compression, Elevation) protocol as directed.   Wear the knee brace for support and stabilization.   Follow-up with the referred orthopedic doctor in 1-2 weeks if no improvement in symptoms.   Return to the Emergency Department for any worsening pain, redness/swelling of the knee, fever, numbness/weakness of the leg or any other worsening or concerning symptoms.

## 2017-09-04 NOTE — ED Provider Notes (Signed)
[facility]   CSN: [id number] Arrival date & time: 09/04/17  1454     History   Chief Complaint Chief Complaint  Patient presents with  . Knee Pain    HPI Breanna Khan is a 52 y.o. female this past medical history of hypertension, CHF, MI who presents for evaluation of left knee pain and swelling.  Patient reports she has a history of chronic knee pain and swelling to the left knee.  She states that it will intermittently be symptomatic and then improved.  She states that symptoms are worse after she has been working long hours.  She states that at her job, she has to stand for 10 hours a day.  She states that her pain is often worse after working.  Patient reports that she started having worsening pain last night and then started having some associated swelling to the left knee.  She states she has put ice on the knee.  Patient is taking ibuprofen with minimal improvement in symptoms.  She states she has had difficulty in bleeding secondary to pain.  Patient states that she has not had any redness or warmth of the knee.  Patient denies any preceding trauma, injury, fall.  Patient denies any fever, numbness/weakness, warmth, erythema.  The history is provided by the patient.    Past Medical History:  Diagnosis Date  . CHF (congestive heart failure) [facility]) November 27 2014  . Hypertension Dx June 2016  . Myocardial infarction [facility]) June 15,20116    Patient Active Problem List   Diagnosis Date Noted  . Hypokalemia 12/26/2014  . Healthcare maintenance 12/26/2014  . Hypertension 12/04/2014  . Acute coronary syndrome (HCC) 11/27/2014  . Smokes 1/2 pack per day 11/27/2014  . 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 Accelerated essential hypertension 11/27/2014  . Acute systolic HF (heart failure) (HCC) 11/27/2014    Past Surgical History:  Procedure Laterality Date  . ABDOMINAL HYSTERECTOMY    . CARDIAC CATHETERIZATION N/A 11/27/2014     Procedure: Left Heart Cath and Coronary Angiography;  Surgeon: Lyn Records, MD;  Location: [facility] INVASIVE CV LAB;  Service: Cardiovascular;  Laterality: N/A;  . CARDIAC CATHETERIZATION N/A 11/27/2014   Procedure: Coronary Balloon Angioplasty;  Surgeon: Lyn Records, MD;  Location: [facility] INVASIVE CV LAB;  Service: Cardiovascular;  Laterality: N/A;  . TUBAL LIGATION       OB History   None      Home Medications    Prior to Admission medications   Medication Sig Start Date End Date Taking? Authorizing Provider  aspirin 81 MG chewable tablet Chew 1 tablet (81 mg total) by mouth daily. Patient not taking: Reported on 03/27/2015 12/26/14   Dessa Phi, MD  atorvastatin (LIPITOR) 80 MG tablet Take 1 tablet (80 mg total) by mouth daily at 6 PM. Patient not taking: Reported on 03/27/2015 12/26/14   Dessa Phi, MD  carvedilol (COREG) 12.5 MG tablet Take 1 tablet (12.5 mg total) by mouth 2 (two) times daily with a meal. Patient not taking: Reported on 03/27/2015 12/26/14   Dessa Phi, MD  clopidogrel (PLAVIX) 75 MG tablet Take 1 tablet (75 mg total) by mouth daily with breakfast. Patient not taking: Reported on 03/27/2015 12/26/14   Dessa Phi, MD  lisinopril (PRINIVIL,ZESTRIL) 5 MG tablet Take 1 tablet (5 mg total) by mouth daily. Patient not taking: Reported on 03/27/2015 12/26/14   Dessa Phi, MD  magnesium oxide (MAG-OX) 400 (241.3 MG) MG tablet  Take 1 tablet (400 mg total) by mouth daily. Patient not taking: Reported on 03/27/2015 12/26/14   Dessa Phi, MD    Family History Family History  Problem Relation Age of Onset  . Heart failure Mother   . Canavan disease Father   . Cancer Father   . Heart failure Brother     Social History Social History   Tobacco Use  . Smoking status: Current Every Day Smoker    Packs/day: 0.50    Years: 20.00    Pack years: 10.00    Types: Cigarettes    Last attempt to quit: 11/25/2014    Years since quitting: 2.7  .  Smokeless tobacco: Never Used  Substance Use Topics  . Alcohol use: Yes    Alcohol/week: 0.0 oz    Comment: socially  . Drug use: Yes    Types: Marijuana     Allergies   Patient has no known allergies.   Review of Systems Review of Systems  Constitutional: Negative for fever.  Gastrointestinal: Negative for vomiting.  Musculoskeletal:       Knee pain  Skin: Negative for color change.  Neurological: Negative for weakness, numbness and headaches.     Physical Exam Updated Vital Signs BP 125/79 (BP Location: Left Arm)   Pulse 96   Temp 98.3 F (36.8 C) (Oral)   Resp 18   SpO2 96%   Physical Exam  Constitutional: She appears well-developed and well-nourished.  HENT:  Head: Normocephalic and atraumatic.  Eyes: Conjunctivae and EOM are normal. Right eye exhibits no discharge. Left eye exhibits no discharge. No scleral icterus.  Cardiovascular:  Pulses:      Dorsalis pedis pulses are 2+ on the right side, and 2+ on the left side.  Pulmonary/Chest: Effort normal.  Musculoskeletal:  Tenderness palpation overlying the anterior aspect of the knee.  No deformity or crepitus noted.  Mild overlying soft tissue swelling superior to the patella.  No surrounding warmth, erythema, induration.  Flexion/extension intact but does some reports some pain with flexion.  Negative anterior and posterior drawer test.  No instability noted on varus or valgus stress.  No tenderness palpation to right hip, right tib-fib, right ankle.  Internal and external rotation of right lower extremity intact without any difficulty.  No abnormalities of the left lower extremity.  No calf tenderness bilaterally.  Neurological: She is alert.  Sensation intact along major nerve distributions of BLE  Skin: Skin is warm and dry.  Good distal cap refill. RLE is not dusky in appearance or cool to touch.  Psychiatric: She has a normal mood and affect. Her speech is normal and behavior is normal.  Nursing note and  vitals reviewed.    ED Treatments / Results  Labs (all labs ordered are listed, but only abnormal results are displayed) Labs Reviewed - No data to display  EKG None  Radiology Dg Knee Complete 4 Views Left  Result Date: 09/04/2017 CLINICAL DATA:  Medial knee pain, no known injury, initial encounter EXAM: LEFT KNEE - COMPLETE 4+ VIEW COMPARISON:  03/23/2015 FINDINGS: Degenerative changes are noted most prominent in the medial joint space. Some lateral subluxation of the tibia with respect to the distal femur is noted. No acute fracture or dislocation is seen. Small joint effusion is noted. IMPRESSION: Degenerative changes with as described slightly more prominent than that seen on the prior exam. No acute abnormality noted. Electronically Signed   By: Alcide Clever M.D.   On: 09/04/2017 16:26  Procedures Procedures (including critical care time)  Medications Ordered in ED Medications  ketorolac (TORADOL) injection 60 mg (60 mg Intramuscular Given 09/04/17 1534)     Initial Impression / Assessment and Plan / ED Course  I have reviewed the triage vital signs and the nursing notes.  Pertinent labs & imaging results that were available during my care of the patient were reviewed by me and considered in my medical decision making (see chart for details).     52 year old female who presents for evaluation of left knee pain and swelling that began last night.  Reports a history of chronic left knee pain and swelling states it is worse after she works.  No preceding trauma, injury, fall.  No numbness, weakness, warmth, erythema, fevers. Patient is afebrile, non-toxic appearing, sitting comfortably on examination table. Vital signs reviewed and stable. Patient is neurovascularly intact.  Exam with diffuse tenderness palpation overlying the anterior aspect of the left knee.  Mild overlying soft tissue swelling superior to the patella.  No overlying warmth, erythema, induration.  Suspect  chronic knee pain versus osteoarthritis versus overuse injury.  Also consider strain versus sprain.  Low suspicion for fracture dislocation given lack of trauma.  History/physical exam is not concerning for septic arthritis, DVT of the right lower extremity, acute arterial embolism. Plan to obtain XR imaging for further evaluation. Analgesics provided in the department.   X-rays reviewed.  Degenerative changes noted in the left knee but no acute fracture or dislocation.  I discussed results with patient.  She reports improvement after analgesics.  I suspect the symptoms are likely secondary to patient's chronic pain/overuse injury.  No evidence for septic arthritis here in the ED.  Vital signs are stable.  Plan to send patient home in a knee sleeve.  Discussed supportive at home measures with patient.  Will give referral to outpatient orthopedic office that patient can follow-up with if her symptoms do not improve in the next few weeks. Patient had ample opportunity for questions and discussion. All patient's questions were answered with full understanding. Strict return precautions discussed. Patient expresses understanding and agreement to plan.   Final Clinical Impressions(s) / ED Diagnoses   Final diagnoses:  Acute pain of left knee    ED Discharge Orders    None       Rosana Hoes 09/04/17 1708    Lorre Nick, MD 09/05/17 [phone]

## 2017-09-04 NOTE — ED Triage Notes (Signed)
She tells me she has chronic left knee pain and swelling. She also cites "standing at my job 10 hours a day". She is here today d/t the pain in her left knee "is so bad today I can't even walk on it". She tells me she has not seeked any medical treatment or orthopedic consult thus far.

## 2017-09-04 NOTE — ED Notes (Signed)
Bed: WA15 Expected date:  Expected time:  Means of arrival:  Comments: EMS  

## 2019-01-25 ENCOUNTER — Ambulatory Visit
Admission: RE | Admit: 2019-01-25 | Discharge: 2019-01-25 | Disposition: A | Discharge status: Home / Self Care | Payer: No Typology Code available for payment source | Source: Ambulatory Visit | Attending: Family Medicine | Admitting: Family Medicine

## 2019-01-25 ENCOUNTER — Other Ambulatory Visit: Payer: Self-pay | Admitting: Family Medicine

## 2019-01-25 DIAGNOSIS — R609 Edema, unspecified: Secondary | ICD-10-CM

## 2019-01-25 DIAGNOSIS — R52 Pain, unspecified: Secondary | ICD-10-CM

## 2019-05-24 ENCOUNTER — Ambulatory Visit: Payer: No Typology Code available for payment source | Admitting: Interventional Cardiology

## 2019-06-18 ENCOUNTER — Encounter: Payer: Self-pay | Admitting: General Practice

## 2019-08-20 ENCOUNTER — Ambulatory Visit: Payer: No Typology Code available for payment source | Admitting: Interventional Cardiology

## 2019-09-07 ENCOUNTER — Encounter: Payer: Self-pay | Admitting: Gastroenterology

## 2019-09-07 ENCOUNTER — Telehealth: Payer: Self-pay | Admitting: Interventional Cardiology

## 2019-09-07 NOTE — Telephone Encounter (Signed)
New message:    Patient calling stating some one called her concering some insurance information.

## 2019-09-18 ENCOUNTER — Encounter: Payer: Self-pay | Admitting: Interventional Cardiology

## 2019-09-18 ENCOUNTER — Ambulatory Visit: Payer: Medicaid Other | Admitting: Interventional Cardiology

## 2019-09-18 ENCOUNTER — Other Ambulatory Visit: Payer: Self-pay

## 2019-09-18 VITALS — BP 150/98 | HR 70 | Ht 67.0 in | Wt 209.1 lb

## 2019-09-18 DIAGNOSIS — I10 Essential (primary) hypertension: Secondary | ICD-10-CM

## 2019-09-18 DIAGNOSIS — R0609 Other forms of dyspnea: Secondary | ICD-10-CM

## 2019-09-18 DIAGNOSIS — I2510 Atherosclerotic heart disease of native coronary artery without angina pectoris: Secondary | ICD-10-CM

## 2019-09-18 DIAGNOSIS — I429 Cardiomyopathy, unspecified: Secondary | ICD-10-CM

## 2019-09-18 DIAGNOSIS — E785 Hyperlipidemia, unspecified: Secondary | ICD-10-CM

## 2019-09-18 DIAGNOSIS — R0600 Dyspnea, unspecified: Secondary | ICD-10-CM | POA: Diagnosis not present

## 2019-09-18 MED ORDER — ASPIRIN EC 81 MG PO TBEC: 81.0000 mg | DELAYED_RELEASE_TABLET | Freq: Every day | ORAL | 3 refills | Status: AC

## 2019-09-18 MED ORDER — LISINOPRIL 10 MG PO TABS: 10.0000 mg | ORAL_TABLET | Freq: Every day | ORAL | 3 refills | Status: DC

## 2019-09-18 NOTE — Patient Instructions (Signed)
Medication Instructions:  Your physician has recommended you make the following change in your medication:   1. START: lisinopril 10 mg once a day  2. START: aspirin 81 mg once a day with food  *If you need a refill on your cardiac medications before your next appointment, please call your pharmacy*   Lab Work: Your physician recommends that you return for a FASTING lipid profile and complete metabolic panel  If you have labs (blood work) drawn today and your tests are completely normal, you will receive your results only by: Marland Kitchen MyChart Message (if you have MyChart) OR . A paper copy in the mail If you have any lab test that is abnormal or we need to change your treatment, we will call you to review the results.   Testing/Procedures: Your physician has requested that you have an echocardiogram. Echocardiography is a painless test that uses sound waves to create images of your heart. It provides your doctor with information about the size and shape of your heart and how well your heart's chambers and valves are working. This procedure takes approximately one hour. There are no restrictions for this procedure.  Follow-Up: At [facility], you and your health needs are our priority.  As part of our continuing mission to provide you with exceptional heart care, we have created designated Provider Care Teams.  These Care Teams include your primary Cardiologist (physician) and Advanced Practice Providers (APPs -  Physician Assistants and Nurse Practitioners) who all work together to provide you with the care you need, when you need it.  We recommend signing up for the patient portal called "MyChart".  Sign up information is provided on this After Visit Summary.  MyChart is used to connect with patients for Virtual Visits (Telemedicine).  Patients are able to view lab/test results, encounter notes, upcoming appointments, etc.  Non-urgent messages can be sent to your provider as well.   To learn  more about what you can do with MyChart, go to [email or web address].    Your next appointment:   6 month(s)  The format for your next appointment:   In Person  Provider:   You may see Lance Muss, MD or one of the following Advanced Practice Providers on your designated Care Team:    Ronie Spies, PA-C  Jacolyn Reedy, PA-C    Other Instructions

## 2019-09-18 NOTE — Progress Notes (Signed)
Cardiology Office Note   Date:  09/18/2019   ID:  Breanna Khan, DOB 05/02/1966, MRN [id number]  PCP:  Breanna Moment, MD    No chief complaint on file.  CAD  Wt Readings from Last 3 Encounters:  09/18/19 209 lb 1.9 oz (94.9 kg)  02/01/17 185 lb (83.9 kg)  03/23/15 185 lb (83.9 kg)       History of Present Illness: Breanna Khan is a 54 y.o. female with a history of CAD and MI in 2016.  She had a cardiac cath at that time by Dr. Tamala Khan.  Results as follows: 1. "Ost 1st Mrg lesion, 99% stenosed. There is a 20% residual stenosis post intervention.    Acute coronary syndrome presentation due to high-grade obstruction of the ostium of a large branching first obtuse marginal.  Severe systolic heart failure, possibly acute, etiology uncertain. EF 15-20%.  Successful scoring balloon angioplasty on a 95-99% ostial first obtuse marginal with reduction in stenosis to less than 20% with TIMI grade 3 flow Otherwise widely patent coronary arteries"  Prior visit with Breanna Khan in 2016 shows:"A post PCI echo was performed which showed significant improvement in LV systolic function with EF at 55-60%.  The frequency of her PVCs resolved with supplemenation of electrolytes and BB therapy. "  She has not been seen since 2016.  She needs a knee replacement.  She has to walk stairs daily, and reports no CP.  No NTG use.   She walks at work a lot without any sx.  Past Medical History:  Diagnosis Date  . ACS (acute coronary syndrome) ([place]) 11/27/2014  . Acute systolic HF (heart failure) ([place]) 11/27/2014  . CHF (congestive heart failure) [facility]) November 27 2014  . Chronic knee pain   . 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 History of MI (myocardial infarction)   . Hypertension Dx June 2016  . Hypokalemia 12/26/2014  . Myocardial infarction [facility]) June 15,20116  . Osteoarthritis of both knees   . Prediabetes   . Smokes 1/2 pack per day 11/27/2014  . Vitamin D deficiency     Past Surgical  History:  Procedure Laterality Date  . ABDOMINAL HYSTERECTOMY    . CARDIAC CATHETERIZATION N/A 11/27/2014   Procedure: Left Heart Cath and Coronary Angiography;  Surgeon: Breanna Crome, MD;  Location: [place] CV LAB;  Service: Cardiovascular;  Laterality: N/A;  . CARDIAC CATHETERIZATION N/A 11/27/2014   Procedure: Coronary Balloon Angioplasty;  Surgeon: Breanna Crome, MD;  Location: [place] CV LAB;  Service: Cardiovascular;  Laterality: N/A;  . TUBAL LIGATION       Current Outpatient Medications  Medication Sig Dispense Refill  . aspirin 81 MG chewable tablet Chew 1 tablet (81 mg total) by mouth daily. 30 tablet 5  . calcium-vitamin D (OSCAL WITH D) 500-200 MG-UNIT tablet Take 1 tablet by mouth.    . carvedilol (COREG) 12.5 MG tablet Take 1 tablet (12.5 mg total) by mouth 2 (two) times daily with a meal. 60 tablet 5  . oxyCODONE-acetaminophen (PERCOCET/ROXICET) 5-325 MG tablet TAKE 1 2 TABLETS BY ORAL ROUTE DAILY AS NEEDED DX  M25.562    . simvastatin (ZOCOR) 40 MG tablet Take 40 mg by mouth daily.     No current facility-administered medications for this visit.    Allergies:   Patient has no known allergies.    Social History:  The patient  reports that she has been smoking cigarettes. She has a 10.00 pack-year smoking history.  She has never used smokeless tobacco. She reports current alcohol use. She reports current drug use. Drug: Marijuana.   Family History:  The patient's family history includes Breanna Khan disease in her father; Cancer in her father; Heart failure in her brother and mother.    ROS:  Please see the history of present illness.   Otherwise, review of systems are positive for DOE, right knee swelling.   All other systems are reviewed and negative.    PHYSICAL EXAM: VS:  BP (!) 150/98   Pulse 70   Ht 5' 7"  (1.702 m)   Wt 209 lb 1.9 oz (94.9 kg)   SpO2 93%   BMI 32.75 kg/m  , BMI Body mass index is 32.75 kg/m. GEN: Well nourished, well developed, in no  acute distress  HEENT: normal  Neck: no JVD, carotid bruits, or masses Cardiac: RRR; no murmurs, rubs, or gallops,no edema  Respiratory:  clear to auscultation bilaterally, normal work of breathing GI: soft, nontender, nondistended, + BS MS: no deformity or atrophy  Skin: warm and dry, no rash Neuro:  Strength and sensation are intact Psych: euthymic mood, full affect   EKG:   The ekg ordered today demonstrates NSR, no ST changes   Recent Labs: No results found for requested labs within last 8760 hours.   Lipid Panel    Component Value Date/Time   CHOL 84 01/09/2015 0743   TRIG 34.0 01/09/2015 0743   HDL 29.60 (L) 01/09/2015 0743   CHOLHDL 3 01/09/2015 0743   VLDL 6.8 01/09/2015 0743   LDLCALC 48 01/09/2015 0743     Other studies Reviewed: Additional studies/ records that were reviewed today with results demonstrating: Labs from Breanna Khan reviewed.   ASSESSMENT AND PLAN:  1.   CAD: Prior [place] Balloon angioplasty.  No angina.  Restart aspirin 81 mg daily.  I explained to her that coronary artery disease is an ongoing process and that she should continue with secondary prevention including beta-blocker, ACE inhibitor and statin along with aspirin. 2. DOE: Check echo given prior cardiomyopathy.  She has been off of meds for some time.  3. HTN: Add lisinopril 10 mg daily.  She will need labs in a few weeks. 4. Preoperative eval: Based on echocardiogram results.  Hopefully, her LV function has stayed normal despite being off of some of her heart failure medicines for several years. 5. Hyperlipidemia: Simvastatin was restarted a few months ago.  Will needs lipids rechecked.  6. Tobacco abuse: I encouraged her to stop smoking.    Current medicines are reviewed at length with the patient today.  The patient concerns regarding her medicines were addressed.  The following changes have been made: Add lisinopril  Labs/ tests ordered today include: C met, lipids when fasting in a  few weeks No orders of the defined types were placed in this encounter.   Recommend 150 minutes/week of aerobic exercise Low fat, low carb, high fiber diet recommended  Disposition:   FU in 6 months with APP   Signed, Larae Grooms, MD  09/18/2019 9:26 AM    [facility] [address], [place], [place]  [address] Phone: [phone]; Fax: [phone]

## 2019-09-25 ENCOUNTER — Ambulatory Visit (AMBULATORY_SURGERY_CENTER): Payer: Self-pay

## 2019-09-25 ENCOUNTER — Other Ambulatory Visit: Payer: Self-pay

## 2019-09-25 VITALS — Temp 97.3°F | Ht 73.0 in | Wt 207.0 lb

## 2019-09-25 DIAGNOSIS — Z01818 Encounter for other preprocedural examination: Secondary | ICD-10-CM

## 2019-09-25 DIAGNOSIS — Z1211 Encounter for screening for malignant neoplasm of colon: Secondary | ICD-10-CM

## 2019-09-25 MED ORDER — NA SULFATE-K SULFATE-MG SULF 17.5-3.13-1.6 GM/177ML PO SOLN: 1.0000 | Freq: Once | ORAL | 0 refills | Status: AC

## 2019-09-25 NOTE — Progress Notes (Signed)
No egg or soy allergy known to patient  No issues with past sedation with any surgeries  or procedures, no intubation problems  No diet pills per patient No home 02 use per patient  No blood thinners per patient  Pt denies issues with constipation  No A fib or A flutter  EMMI video sent to pt's e mail   suprep medicaid   Hx of MI, no blood thinners, only low dose aspirin now.  Due to the COVID-19 pandemic we are asking patients to follow these guidelines. Please only bring one care partner. Please be aware that your care partner may wait in the car in the parking lot or if they feel like they will be too hot to wait in the car, they may wait in the lobby on the 4th floor. All care partners are required to wear a mask the entire time (we do not have any that we can provide them), they need to practice social distancing, and we will do a Covid check for all patient's and care partners when you arrive. Also we will check their temperature and your temperature. If the care partner waits in their car they need to stay in the parking lot the entire time and we will call them on their cell phone when the patient is ready for discharge so they can bring the car to the front of the building. Also all patient's will need to wear a mask into building.

## 2019-10-05 ENCOUNTER — Other Ambulatory Visit: Payer: Medicaid Other | Admitting: *Deleted

## 2019-10-05 ENCOUNTER — Other Ambulatory Visit: Payer: Self-pay

## 2019-10-05 ENCOUNTER — Ambulatory Visit (HOSPITAL_COMMUNITY): Payer: Medicaid Other | Attending: Cardiovascular Disease

## 2019-10-05 DIAGNOSIS — E785 Hyperlipidemia, unspecified: Secondary | ICD-10-CM

## 2019-10-05 DIAGNOSIS — I10 Essential (primary) hypertension: Secondary | ICD-10-CM

## 2019-10-05 DIAGNOSIS — I429 Cardiomyopathy, unspecified: Secondary | ICD-10-CM

## 2019-10-05 DIAGNOSIS — R0609 Other forms of dyspnea: Secondary | ICD-10-CM

## 2019-10-05 DIAGNOSIS — I2510 Atherosclerotic heart disease of native coronary artery without angina pectoris: Secondary | ICD-10-CM

## 2019-10-05 DIAGNOSIS — R0600 Dyspnea, unspecified: Secondary | ICD-10-CM | POA: Insufficient documentation

## 2019-10-05 LAB — COMPREHENSIVE METABOLIC PANEL
ALT: 11 IU/L (ref 0–32)
AST: 18 IU/L (ref 0–40)
Albumin/Globulin Ratio: 1.3 (ref 1.2–2.2)
Albumin: 4.2 g/dL (ref 3.8–4.9)
Alkaline Phosphatase: 76 IU/L (ref 39–117)
BUN/Creatinine Ratio: 16 (ref 9–23)
BUN: 13 mg/dL (ref 6–24)
Bilirubin Total: 0.5 mg/dL (ref 0.0–1.2)
CO2: 28 mmol/L (ref 20–29)
Calcium: 9.6 mg/dL (ref 8.7–10.2)
Chloride: 105 mmol/L (ref 96–106)
Creatinine, Ser: 0.81 mg/dL (ref 0.57–1.00)
GFR calc Af Amer: 96 mL/min/{1.73_m2} (ref >59)
GFR calc non Af Amer: 83 mL/min/{1.73_m2} (ref >59)
Globulin, Total: 3.2 g/dL (ref 1.5–4.5)
Glucose: 90 mg/dL (ref 65–99)
Potassium: 5.1 mmol/L (ref 3.5–5.2)
Sodium: 143 mmol/L (ref 134–144)
Total Protein: 7.4 g/dL (ref 6.0–8.5)

## 2019-10-05 LAB — LIPID PANEL
Chol/HDL Ratio: 3.3 ratio (ref 0.0–4.4)
Cholesterol, Total: 183 mg/dL (ref 100–199)
HDL: 55 mg/dL (ref >39)
LDL Chol Calc (NIH): 114 mg/dL — ABNORMAL HIGH (ref 0–99)
Triglycerides: 73 mg/dL (ref 0–149)
VLDL Cholesterol Cal: 14 mg/dL (ref 5–40)

## 2019-10-09 ENCOUNTER — Encounter: Payer: No Typology Code available for payment source | Admitting: Gastroenterology

## 2019-10-09 ENCOUNTER — Telehealth: Payer: Self-pay | Admitting: Interventional Cardiology

## 2019-10-09 DIAGNOSIS — I2510 Atherosclerotic heart disease of native coronary artery without angina pectoris: Secondary | ICD-10-CM

## 2019-10-09 DIAGNOSIS — E785 Hyperlipidemia, unspecified: Secondary | ICD-10-CM

## 2019-10-09 MED ORDER — ROSUVASTATIN CALCIUM 20 MG PO TABS: 20.0000 mg | ORAL_TABLET | Freq: Every day | ORAL | 3 refills | Status: DC

## 2019-10-09 NOTE — Telephone Encounter (Signed)
Patient calling Daleen Bo back in regards to lab results from yesterday.

## 2019-10-09 NOTE — Telephone Encounter (Signed)
I spoke with patient and reviewed echo and lab results with her.  Will send prescription for Rosuvastatin to [facility] on [address]  She will come in for fasting lab work on July 27,2021. Patient is asking if she can now proceed with knee surgery. If so needs clearance sent to surgeon

## 2019-10-10 NOTE — Telephone Encounter (Signed)
Follow up  Pt is returning call, she said she's at work and to leave a detailed message if she did not answer. Also, she said, she can receive a call after 4:45pm

## 2019-10-10 NOTE — Telephone Encounter (Signed)
Left message for patient to call back.  Dr. Eldridge Dace has cleared the patient for surgery. We will fax over clearance to the surgeon.

## 2019-10-10 NOTE — Telephone Encounter (Signed)
Left a detailed message letting the patient know that we have faxed the echo and lab results to Dr. Lazarus Salines and have faxed the clearance request with OV notes, labs, echo, and EKG to [facility]. Confirmation received. Instructed the patient to call back with any questions or concerns.

## 2019-10-10 NOTE — Telephone Encounter (Signed)
Follow up  Pt called back again, she said she spoke with he PCP and they would like to receive copy of her echo and blood work results to send to Dr. Lazarus Salines

## 2019-10-19 ENCOUNTER — Other Ambulatory Visit: Payer: Self-pay | Admitting: Family Medicine

## 2019-10-19 DIAGNOSIS — Z1231 Encounter for screening mammogram for malignant neoplasm of breast: Secondary | ICD-10-CM

## 2019-11-02 ENCOUNTER — Ambulatory Visit: Payer: Medicaid Other

## 2019-11-08 ENCOUNTER — Ambulatory Visit
Admission: RE | Admit: 2019-11-08 | Discharge: 2019-11-08 | Disposition: A | Discharge status: Home / Self Care | Payer: Medicaid Other | Source: Ambulatory Visit | Attending: Family Medicine | Admitting: Family Medicine

## 2019-11-08 ENCOUNTER — Encounter: Payer: Self-pay | Admitting: Radiology

## 2019-11-08 ENCOUNTER — Other Ambulatory Visit: Payer: Self-pay | Admitting: Family Medicine

## 2019-11-08 ENCOUNTER — Other Ambulatory Visit: Payer: Self-pay

## 2019-11-08 DIAGNOSIS — R0602 Shortness of breath: Secondary | ICD-10-CM

## 2019-11-08 DIAGNOSIS — Z720 Tobacco use: Secondary | ICD-10-CM

## 2019-11-08 DIAGNOSIS — Z1231 Encounter for screening mammogram for malignant neoplasm of breast: Secondary | ICD-10-CM

## 2020-01-08 ENCOUNTER — Other Ambulatory Visit: Payer: Medicaid Other

## 2020-05-06 ENCOUNTER — Ambulatory Visit: Payer: Medicaid Other | Admitting: Interventional Cardiology

## 2020-05-29 NOTE — Progress Notes (Deleted)
Cardiology Office Note   Date:  05/29/2020   ID:  Breanna Khan, DOB 24-Oct-1965, MRN [id number]  PCP:  Laruth Bouchard, MD    No chief complaint on file.  CAD  Wt Readings from Last 3 Encounters:  09/25/19 207 lb (93.9 kg)  09/18/19 209 lb 1.9 oz (94.9 kg)  02/01/17 185 lb (83.9 kg)       History of Present Illness: Breanna Khan is a 54 y.o. female   with a history of CAD and MI in 2016.  She had a cardiac cath at that time by Dr. Katrinka Blazing.  Results as follows: 1. "Ost 1st Mrg lesion, 99% stenosed. There is a 20% residual stenosis post intervention.   Acute coronary syndrome presentation due to high-grade obstruction of the ostium of a large branching first obtuse marginal.  Severe systolic heart failure, possibly acute, etiology uncertain. EF 15-20%.  Successful scoring balloon angioplasty on a 95-99% ostial first obtuse marginal with reduction in stenosis to less than 20% with TIMI grade 3 flow Otherwise widely patent coronary arteries"  Prior visit with Lynnae Sandhoff in 2016 shows:"A post PCI echo was performed which showed significant improvement in LV systolic function with EF at 55-60%. The frequency of her PVCs resolved with supplemenation of electrolytes and BB therapy. "  Cleared for TKR in early 2021.   Echo in 2021 showed: "Left ventricular ejection fraction, by estimation, is 50 to 55%. The  left ventricle has low normal function. The left ventricle demonstrates  regional wall motion abnormalities; there is mild apical-mid inferolateral  hypokinesis. Left ventricular  diastolic parameters were normal.  2. Right ventricular systolic function is normal. The right ventricular  size is normal. There is normal pulmonary artery systolic pressure.  3. Left atrial size was mildly dilated.  4. The mitral valve is normal in structure. Trivial mitral valve  regurgitation. No evidence of mitral stenosis.  5. The aortic valve is normal in structure. Aortic  valve regurgitation is  not visualized. No aortic stenosis is present.  6. The inferior vena cava is normal in size with greater than 50%  respiratory variability, suggesting right atrial pressure of 3 mmHg. "    Past Medical History:  Diagnosis Date  . ACS (acute coronary syndrome) (HCC) 11/27/2014  . Acute systolic HF (heart failure) (HCC) 11/27/2014  . CHF (congestive heart failure) [facility]) November 27 2014  . Chronic knee pain   . 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 History of MI (myocardial infarction)   . Hypertension Dx June 2016  . Hypokalemia 12/26/2014  . Myocardial infarction (HCC) 11/27/2014   stent placed  . Osteoarthritis of both knees   . Prediabetes   . Smokes 1/2 pack per day 11/27/2014  . Vitamin D deficiency     Past Surgical History:  Procedure Laterality Date  . ABDOMINAL HYSTERECTOMY    . CARDIAC CATHETERIZATION N/A 11/27/2014   Procedure: Left Heart Cath and Coronary Angiography;  Surgeon: Lyn Records, MD;  Location: [facility] INVASIVE CV LAB;  Service: Cardiovascular;  Laterality: N/A;  . CARDIAC CATHETERIZATION N/A 11/27/2014   Procedure: Coronary Balloon Angioplasty;  Surgeon: Lyn Records, MD;  Location: [facility] INVASIVE CV LAB;  Service: Cardiovascular;  Laterality: N/A;  . TUBAL LIGATION       Current Outpatient Medications  Medication Sig Dispense Refill  . aspirin EC 81 MG tablet Take 1 tablet (81 mg total) by mouth daily. 90 tablet 3  . calcium-vitamin D (OSCAL WITH D) 500-200  MG-UNIT tablet Take 1 tablet by mouth.    . carvedilol (COREG) 12.5 MG tablet Take 1 tablet (12.5 mg total) by mouth 2 (two) times daily with a meal. 60 tablet 5  . lisinopril (ZESTRIL) 10 MG tablet Take 1 tablet (10 mg total) by mouth daily. 90 tablet 3  . oxyCODONE-acetaminophen (PERCOCET/ROXICET) 5-325 MG tablet TAKE 1 2 TABLETS BY ORAL ROUTE DAILY AS NEEDED DX  M25.562    . rosuvastatin (CRESTOR) 20 MG tablet Take 1 tablet (20 mg total) by mouth daily. 90 tablet 3   No current  facility-administered medications for this visit.    Allergies:   Patient has no known allergies.    Social History:  The patient  reports that she has been smoking cigarettes. She has a 10.00 pack-year smoking history. She has never used smokeless tobacco. She reports current alcohol use of about 3.0 standard drinks of alcohol per week. She reports current drug use. Drug: Marijuana.   Family History:  The patient's ***family history includes Canavan disease in her father; Cancer in her father; Heart failure in her brother and mother.    ROS:  Please see the history of present illness.   Otherwise, review of systems are positive for ***.   All other systems are reviewed and negative.    PHYSICAL EXAM: VS:  There were no vitals taken for this visit. , BMI There is no height or weight on file to calculate BMI. GEN: Well nourished, well developed, in no acute distress  HEENT: normal  Neck: no JVD, carotid bruits, or masses Cardiac: ***RRR; no murmurs, rubs, or gallops,no edema  Respiratory:  clear to auscultation bilaterally, normal work of breathing GI: soft, nontender, nondistended, + BS MS: no deformity or atrophy  Skin: warm and dry, no rash Neuro:  Strength and sensation are intact Psych: euthymic mood, full affect   EKG:   The ekg ordered today demonstrates ***   Recent Labs: 10/05/2019: ALT 11; BUN 13; Creatinine, Ser 0.81; Potassium 5.1; Sodium 143   Lipid Panel    Component Value Date/Time   CHOL 183 10/05/2019 0830   TRIG 73 10/05/2019 0830   HDL 55 10/05/2019 0830   CHOLHDL 3.3 10/05/2019 0830   CHOLHDL 3 01/09/2015 0743   VLDL 6.8 01/09/2015 0743   LDLCALC 114 (H) 10/05/2019 0830     Other studies Reviewed: Additional studies/ records that were reviewed today with results demonstrating: ***.   ASSESSMENT AND PLAN:  1. CAD:  2. HTN: 3. Hyperlipidemia: 4. Tobacco abuse:   Current medicines are reviewed at length with the patient today.  The patient  concerns regarding her medicines were addressed.  The following changes have been made:  No change***  Labs/ tests ordered today include: *** No orders of the defined types were placed in this encounter.   Recommend 150 minutes/week of aerobic exercise Low fat, low carb, high fiber diet recommended  Disposition:   FU in ***   Signed, Lance Muss, MD  05/29/2020 1:08 PM    [facility] [address] [place], [place], [place]  [address] Phone: [phone]; Fax: [phone]

## 2020-05-30 ENCOUNTER — Ambulatory Visit: Payer: Medicaid Other | Admitting: Interventional Cardiology

## 2020-06-02 NOTE — Progress Notes (Signed)
Cardiology Office Note   Date:  06/04/2020   ID:  Breanna Khan, DOB February 13, 1966, MRN [id number]  PCP:  Breanna Bouchard, MD    No chief complaint on file.  CAD  Wt Readings from Last 3 Encounters:  06/04/20 204 lb 4.8 oz (92.7 kg)  09/25/19 207 lb (93.9 kg)  09/18/19 209 lb 1.9 oz (94.9 kg)       History of Present Illness: Breanna Khan is a 54 y.o. female   with a history of CAD and MI in Oct 20, 2014.  She had a cardiac cath at that time by Dr. Katrinka Khan.  Results as follows: 1. "Ost 1st Mrg lesion, 99% stenosed. There is a 20% residual stenosis post intervention.   Acute coronary syndrome presentation due to high-grade obstruction of the ostium of a large branching first obtuse marginal.  Severe systolic Breanna Khan, possibly acute, etiology uncertain. EF 15-20%.  Successful scoring balloon angioplasty on a 95-99% ostial first obtuse marginal with reduction in stenosis to less than 20% with TIMI grade 3 flow Otherwise widely patent coronary arteries"  Prior visit with Breanna Khan in 10-20-14 shows:"A post PCI echo was performed which showed significant improvement in LV systolic function with EF at 55-60%. The frequency of her PVCs resolved with supplemenation of electrolytes and BB therapy. "  Cleared for TKR in early 10/20/19. THis did not happen due to a death in the family.  Echo in 10-20-19 showed: "Left ventricular ejection fraction, by estimation, is 50 to 55%. The  left ventricle has low normal function. The left ventricle demonstrates  regional wall motion abnormalities; there is mild apical-mid inferolateral  hypokinesis. Left ventricular  diastolic parameters were normal.  2. Right ventricular systolic function is normal. The right ventricular  size is normal. There is normal pulmonary artery systolic pressure.  3. Left atrial size was mildly dilated.  4. The mitral valve is normal in structure. Trivial mitral valve  regurgitation. No evidence of mitral stenosis.   5. The aortic valve is normal in structure. Aortic valve regurgitation is  not visualized. No aortic stenosis is present.  6. The inferior vena cava is normal in size with greater than 50%  respiratory variability, suggesting right atrial pressure of 3 mmHg. "  Denies : exertional Chest pain. Dizziness. Leg edema. Nitroglycerin use. Orthopnea. Palpitations. Paroxysmal nocturnal dyspnea. Syncope.   She stopped working on 05/05/20 due to knee problems. She ill be starting with a new PMD.   Lisinopril was increased to 20 mg daily last month.  At home, BP readings are normal.    Using inhaler as well for breathing.  She continues to smoke. 1/2 ppd.   Past Medical History:  Diagnosis Date  . ACS (acute coronary syndrome) (HCC) 11/27/2014  . Acute systolic HF (Breanna Khan) (HCC) 11/27/2014  . CHF (congestive Breanna Khan) [facility]) November 27 2014  . Chronic knee pain   . 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 History of MI (myocardial infarction)   . Hypertension Dx June 2016  . Hypokalemia 12/26/2014  . Myocardial infarction (HCC) 11/27/2014   stent placed  . Osteoarthritis of both knees   . Prediabetes   . Smokes 1/2 pack per day 11/27/2014  . Vitamin D deficiency     Past Surgical History:  Procedure Laterality Date  . ABDOMINAL HYSTERECTOMY    . CARDIAC CATHETERIZATION N/A 11/27/2014   Procedure: Left Breanna Cath and Coronary Angiography;  Surgeon: Breanna Records, MD;  Location: [facility] INVASIVE CV LAB;  Service: Cardiovascular;  Laterality: N/A;  . CARDIAC CATHETERIZATION N/A 11/27/2014   Procedure: Coronary Balloon Angioplasty;  Surgeon: Breanna Records, MD;  Location: [facility] INVASIVE CV LAB;  Service: Cardiovascular;  Laterality: N/A;  . TUBAL LIGATION       Current Outpatient Medications  Medication Sig Dispense Refill  . aspirin EC 81 MG tablet Take 1 tablet (81 mg total) by mouth daily. 90 tablet 3  . calcium-vitamin D (OSCAL WITH D) 500-200 MG-UNIT tablet Take 1 tablet by mouth.     . carvedilol (COREG) 12.5 MG tablet Take 1 tablet (12.5 mg total) by mouth 2 (two) times daily with a meal. 60 tablet 5  . lisinopril (ZESTRIL) 20 MG tablet Take 20 mg by mouth daily.    Marland Kitchen oxyCODONE-acetaminophen (PERCOCET/ROXICET) 5-325 MG tablet TAKE 1 2 TABLETS BY ORAL ROUTE DAILY AS NEEDED DX  M25.562    . PROAIR HFA 108 (90 Base) MCG/ACT inhaler Inhale into the lungs.    . rosuvastatin (CRESTOR) 20 MG tablet Take 1 tablet (20 mg total) by mouth daily. 90 tablet 3   No current facility-administered medications for this visit.    Allergies:   Patient has no known allergies.    Social History:  The patient  reports that she has been smoking cigarettes. She has a 10.00 pack-year smoking history. She has never used smokeless tobacco. She reports current alcohol use of about 3.0 standard drinks of alcohol per week. She reports current drug use. Drug: Marijuana.   Family History:  The patient's family history includes Breanna Khan disease in her father; Breanna Khan in her father; Breanna Khan in her brother and mother.    ROS:  Please see the history of present illness.   Otherwise, review of systems are positive for occasional wheezing.   All other systems are reviewed and negative.    PHYSICAL EXAM: VS:  BP (!) 130/94   Pulse 80   Ht 6\' 1"  (1.854 m)   Wt 204 lb 4.8 oz (92.7 kg)   SpO2 97%   BMI 26.95 kg/m  , BMI Body mass index is 26.95 kg/m. GEN: Well nourished, well developed, in no acute distress  HEENT: normal  Neck: no JVD, carotid bruits, or masses Cardiac: RRR; no murmurs, rubs, or gallops,no edema  Respiratory:  expiratory wheezing to auscultation bilaterally, normal work of breathing GI: soft, nontender, nondistended, + BS MS: no deformity or atrophy  Skin: warm and dry, no rash Neuro:  Strength and sensation are intact Psych: euthymic mood, full affect   EKG:   The ekg ordered 4/21 demonstrates NSR, no ST changes  ECG today appears similar   Recent Labs: 10/05/2019:  ALT 11; BUN 13; Creatinine, Ser 0.81; Potassium 5.1; Sodium 143   Lipid Panel    Component Value Date/Time   CHOL 183 10/05/2019 0830   TRIG 73 10/05/2019 0830   HDL 55 10/05/2019 0830   CHOLHDL 3.3 10/05/2019 0830   CHOLHDL 3 01/09/2015 0743   VLDL 6.8 01/09/2015 0743   LDLCALC 114 (H) 10/05/2019 0830     Other studies Reviewed: Additional studies/ Khan that were reviewed today with results demonstrating: no significant change on ECG.   ASSESSMENT AND PLAN:  1. CAD: No angina.  Continue healthy diet.  Low salt.  Avoid processed foods.  2. HTN: The current medical regimen is effective;  continue present plan and medications.  Readings in the green on her machine at home.  3. Hyperlipidemia: LDL above target in 4/21.  Has been  changed from simvastatin to rosuvastatin.  Needs fasting blood work when she sees her new PMD.   Would like to see LDL around 70.  Will get this checked with new PMD. 4. Tobacco abuse: She needs to stop smoking.  5. Pre-operative clearance:  ECG done today- no change.  EF back to normal .  No further cardiac testing needed before TKR.    Current medicines are reviewed at length with the patient today.  The patient concerns regarding her medicines were addressed.  The following changes have been made:  No change  Labs/ tests ordered today include:  No orders of the defined types were placed in this encounter.   Recommend 150 minutes/week of aerobic exercise Low fat, low carb, high fiber diet recommended  Disposition:   FU in 6 moths   Signed, Lance Muss, MD  06/04/2020 2:18 PM    [facility] [address] [place], [place], [place]  [address] Phone: [phone]; Fax: [phone]

## 2020-06-04 ENCOUNTER — Other Ambulatory Visit: Payer: Self-pay

## 2020-06-04 ENCOUNTER — Ambulatory Visit (INDEPENDENT_AMBULATORY_CARE_PROVIDER_SITE_OTHER): Payer: Medicaid Other | Admitting: Interventional Cardiology

## 2020-06-04 ENCOUNTER — Encounter: Payer: Self-pay | Admitting: Interventional Cardiology

## 2020-06-04 VITALS — BP 130/94 | HR 80 | Ht 73.0 in | Wt 204.3 lb

## 2020-06-04 DIAGNOSIS — I10 Essential (primary) hypertension: Secondary | ICD-10-CM | POA: Diagnosis not present

## 2020-06-04 DIAGNOSIS — I429 Cardiomyopathy, unspecified: Secondary | ICD-10-CM

## 2020-06-04 DIAGNOSIS — E785 Hyperlipidemia, unspecified: Secondary | ICD-10-CM

## 2020-06-04 DIAGNOSIS — I2510 Atherosclerotic heart disease of native coronary artery without angina pectoris: Secondary | ICD-10-CM

## 2020-06-04 DIAGNOSIS — I480 Paroxysmal atrial fibrillation: Secondary | ICD-10-CM

## 2020-06-04 NOTE — Patient Instructions (Addendum)
Medication Instructions:  Your provider recommends that you continue on your current medications as directed. Please refer to the Current Medication list given to you today.   *If you need a refill on your cardiac medications before your next appointment, please call your pharmacy*  Lab Work: Please have a complete metabolic panel and lipid panel when you go to your PCP's office next week. If you have labs (blood work) drawn today and your tests are completely normal, you will receive your results only by: Marland Kitchen MyChart Message (if you have MyChart) OR . A paper copy in the mail If you have any lab test that is abnormal or we need to change your treatment, we will call you to review the results.  Follow-Up: At [facility], you and your health needs are our priority.  As part of our continuing mission to provide you with exceptional heart care, we have created designated Provider Care Teams.  These Care Teams include your primary Cardiologist (physician) and Advanced Practice Providers (APPs -  Physician Assistants and Nurse Practitioners) who all work together to provide you with the care you need, when you need it. Your next appointment:   6 months The format for your next appointment:   In Person Provider:   You may see Lance Muss, MD or one of the following Advanced Practice Providers on your designated Care Team:    Ronie Spies, PA-C  Jacolyn Reedy, PA-C    Low-Sodium Eating Plan Sodium, which is an element that makes up salt, helps you maintain a healthy balance of fluids in your body. Too much sodium can increase your blood pressure and cause fluid and waste to be held in your body. Your health care provider or dietitian may recommend following this plan if you have high blood pressure (hypertension), kidney disease, liver disease, or heart failure. Eating less sodium can help lower your blood pressure, reduce swelling, and protect your heart, liver, and kidneys. What are tips  for following this plan? General guidelines  Most people on this plan should limit their sodium intake to 1,500-2,000 mg (milligrams) of sodium each day. Reading food labels   The Nutrition Facts label lists the amount of sodium in one serving of the food. If you eat more than one serving, you must multiply the listed amount of sodium by the number of servings.  Choose foods with less than 140 mg of sodium per serving.  Avoid foods with 300 mg of sodium or more per serving. Shopping  Look for lower-sodium products, often labeled as "low-sodium" or "no salt added."  Always check the sodium content even if foods are labeled as "unsalted" or "no salt added".  Buy fresh foods. ? Avoid canned foods and premade or frozen meals. ? Avoid canned, cured, or processed meats  Buy breads that have less than 80 mg of sodium per slice. Cooking  Eat more home-cooked food and less restaurant, buffet, and fast food.  Avoid adding salt when cooking. Use salt-free seasonings or herbs instead of table salt or sea salt. Check with your health care provider or pharmacist before using salt substitutes.  Cook with plant-based oils, such as canola, sunflower, or olive oil. Meal planning  When eating at a restaurant, ask that your food be prepared with less salt or no salt, if possible.  Avoid foods that contain MSG (monosodium glutamate). MSG is sometimes added to [place] food, bouillon, and some canned foods. What foods are recommended? The items listed may not be a complete  list. Talk with your dietitian about what dietary choices are best for you. Grains Low-sodium cereals, including oats, puffed wheat and rice, and shredded wheat. Low-sodium crackers. Unsalted rice. Unsalted pasta. Low-sodium bread. Whole-grain breads and whole-grain pasta. Vegetables Fresh or frozen vegetables. "No salt added" canned vegetables. "No salt added" tomato sauce and paste. Low-sodium or reduced-sodium tomato and  vegetable juice. Fruits Fresh, frozen, or canned fruit. Fruit juice. Meats and other protein foods Fresh or frozen (no salt added) meat, poultry, seafood, and fish. Low-sodium canned tuna and salmon. Unsalted nuts. Dried peas, beans, and lentils without added salt. Unsalted canned beans. Eggs. Unsalted nut butters. Dairy Milk. Soy milk. Cheese that is naturally low in sodium, such as ricotta cheese, fresh mozzarella, or Swiss cheese Low-sodium or reduced-sodium cheese. Cream cheese. Yogurt. Fats and oils Unsalted butter. Unsalted margarine with no trans fat. Vegetable oils such as canola or olive oils. Seasonings and other foods Fresh and dried herbs and spices. Salt-free seasonings. Low-sodium mustard and ketchup. Sodium-free salad dressing. Sodium-free light mayonnaise. Fresh or refrigerated horseradish. Lemon juice. Vinegar. Homemade, reduced-sodium, or low-sodium soups. Unsalted popcorn and pretzels. Low-salt or salt-free chips. What foods are not recommended? The items listed may not be a complete list. Talk with your dietitian about what dietary choices are best for you. Grains Instant hot cereals. Bread stuffing, pancake, and biscuit mixes. Croutons. Seasoned rice or pasta mixes. Noodle soup cups. Boxed or frozen macaroni and cheese. Regular salted crackers. Self-rising flour. Vegetables Sauerkraut, pickled vegetables, and relishes. Olives. [place] fries. Onion rings. Regular canned vegetables (not low-sodium or reduced-sodium). Regular canned tomato sauce and paste (not low-sodium or reduced-sodium). Regular tomato and vegetable juice (not low-sodium or reduced-sodium). Frozen vegetables in sauces. Meats and other protein foods Meat or fish that is salted, canned, smoked, spiced, or pickled. Bacon, ham, sausage, hotdogs, corned beef, chipped beef, packaged lunch meats, salt pork, jerky, pickled herring, anchovies, regular canned tuna, sardines, salted nuts. Dairy Processed cheese and  cheese spreads. Cheese curds. Blue cheese. Feta cheese. String cheese. Regular cottage cheese. Buttermilk. Canned milk. Fats and oils Salted butter. Regular margarine. Ghee. Bacon fat. Seasonings and other foods Onion salt, garlic salt, seasoned salt, table salt, and sea salt. Canned and packaged gravies. Worcestershire sauce. Tartar sauce. Barbecue sauce. Teriyaki sauce. Soy sauce, including reduced-sodium. Steak sauce. Fish sauce. Oyster sauce. Cocktail sauce. Horseradish that you find on the shelf. Regular ketchup and mustard. Meat flavorings and tenderizers. Bouillon cubes. Hot sauce and Tabasco sauce. Premade or packaged marinades. Premade or packaged taco seasonings. Relishes. Regular salad dressings. Salsa. Potato and tortilla chips. Corn chips and puffs. Salted popcorn and pretzels. Canned or dried soups. Pizza. Frozen entrees and pot pies. Summary  Eating less sodium can help lower your blood pressure, reduce swelling, and protect your heart, liver, and kidneys.  Most people on this plan should limit their sodium intake to 1,500-2,000 mg (milligrams) of sodium each day.  Canned, boxed, and frozen foods are high in sodium. Restaurant foods, fast foods, and pizza are also very high in sodium. You also get sodium by adding salt to food.  Try to cook at home, eat more fresh fruits and vegetables, and eat less fast food, canned, processed, or prepared foods. This information is not intended to replace advice given to you by your health care provider. Make sure you discuss any questions you have with your health care provider. Document Revised: 05/13/2017 Document Reviewed: 05/24/2016 Elsevier Patient Education  2020 [employer].

## 2020-06-05 NOTE — Addendum Note (Signed)
Addended by: Gunnar Fusi A on: 06/05/2020 02:15 PM   Modules accepted: Orders

## 2020-09-09 ENCOUNTER — Other Ambulatory Visit (HOSPITAL_COMMUNITY): Payer: Medicaid Other

## 2020-09-15 ENCOUNTER — Ambulatory Visit: Admit: 2020-09-15 | Payer: Medicaid Other | Admitting: Orthopedic Surgery

## 2020-09-15 SURGERY — ARTHROPLASTY, KNEE, TOTAL
Anesthesia: Spinal | Site: Knee | Laterality: Right

## 2020-10-22 ENCOUNTER — Other Ambulatory Visit: Payer: Self-pay | Admitting: Family Medicine

## 2020-10-23 ENCOUNTER — Other Ambulatory Visit: Payer: Self-pay | Admitting: Family Medicine

## 2020-10-23 ENCOUNTER — Other Ambulatory Visit: Payer: Self-pay | Admitting: Pulmonary Disease

## 2020-10-23 DIAGNOSIS — Z1231 Encounter for screening mammogram for malignant neoplasm of breast: Secondary | ICD-10-CM

## 2020-11-03 ENCOUNTER — Other Ambulatory Visit: Payer: Self-pay | Admitting: Family Medicine

## 2020-11-03 ENCOUNTER — Other Ambulatory Visit: Payer: Self-pay | Admitting: Neurology

## 2020-11-03 DIAGNOSIS — Z720 Tobacco use: Secondary | ICD-10-CM

## 2020-11-03 DIAGNOSIS — J439 Emphysema, unspecified: Secondary | ICD-10-CM

## 2020-11-14 ENCOUNTER — Other Ambulatory Visit: Payer: Medicaid Other

## 2020-12-18 ENCOUNTER — Ambulatory Visit: Payer: Medicaid Other

## 2020-12-25 ENCOUNTER — Inpatient Hospital Stay: Admission: RE | Admit: 2020-12-25 | Payer: Medicaid Other | Source: Ambulatory Visit

## 2021-01-16 ENCOUNTER — Ambulatory Visit: Payer: Medicaid Other

## 2021-02-03 ENCOUNTER — Inpatient Hospital Stay: Admission: RE | Admit: 2021-02-03 | Payer: Medicaid Other | Source: Ambulatory Visit

## 2021-02-09 ENCOUNTER — Ambulatory Visit
Admission: RE | Admit: 2021-02-09 | Discharge: 2021-02-09 | Disposition: A | Discharge status: Home / Self Care | Payer: Medicaid Other | Source: Ambulatory Visit | Attending: Family Medicine | Admitting: Family Medicine

## 2021-02-09 ENCOUNTER — Other Ambulatory Visit: Payer: Self-pay

## 2021-02-09 DIAGNOSIS — Z1231 Encounter for screening mammogram for malignant neoplasm of breast: Secondary | ICD-10-CM

## 2021-02-20 ENCOUNTER — Ambulatory Visit: Payer: Medicaid Other

## 2021-03-10 ENCOUNTER — Ambulatory Visit
Admission: RE | Admit: 2021-03-10 | Discharge: 2021-03-10 | Disposition: A | Discharge status: Home / Self Care | Payer: Medicaid Other | Source: Ambulatory Visit | Attending: Family Medicine | Admitting: Family Medicine

## 2021-03-10 DIAGNOSIS — Z720 Tobacco use: Secondary | ICD-10-CM

## 2021-03-10 DIAGNOSIS — J439 Emphysema, unspecified: Secondary | ICD-10-CM

## 2021-03-19 NOTE — Progress Notes (Deleted)
Cardiology Office Note   Date:  03/19/2021   ID:  Breanna Khan, DOB 1965/08/05, MRN [id number]  PCP:  Laruth Bouchard, MD (Inactive)    No chief complaint on file.  CAD  Wt Readings from Last 3 Encounters:  06/04/20 204 lb 4.8 oz (92.7 kg)  09/25/19 207 lb (93.9 kg)  09/18/19 209 lb 1.9 oz (94.9 kg)       History of Present Illness: Breanna Khan is a 55 y.o. female  with a history of CAD and MI in 2016.  She had a cardiac cath at that time by Dr. Katrinka Blazing.  Results as follows: "Ost 1st Mrg lesion, 99% stenosed. There is a 20% residual stenosis post intervention.   Acute coronary syndrome presentation due to high-grade obstruction of the ostium of a large branching first obtuse marginal. Severe systolic heart failure, possibly acute, etiology uncertain. EF 15-20%. Successful scoring balloon angioplasty on a 95-99% ostial first obtuse marginal with reduction in stenosis to less than 20% with TIMI grade 3 flow Otherwise widely patent coronary arteries"   Prior visit with Lynnae Sandhoff in 2016 shows:"A post PCI echo was performed which showed significant improvement in LV systolic function with EF at 55-60%.  The frequency of her PVCs resolved with supplemenation of electrolytes and BB therapy. "   Cleared for TKR in early 2021.  THis did not happen due to a death in the family.   Echo in 2021 showed: "Left ventricular ejection fraction, by estimation, is 50 to 55%. The  left ventricle has low normal function. The left ventricle demonstrates  regional wall motion abnormalities; there is mild apical-mid inferolateral  hypokinesis. Left ventricular  diastolic parameters were normal.   2. Right ventricular systolic function is normal. The right ventricular  size is normal. There is normal pulmonary artery systolic pressure.   3. Left atrial size was mildly dilated.   4. The mitral valve is normal in structure. Trivial mitral valve  regurgitation. No evidence of mitral stenosis.    5. The aortic valve is normal in structure. Aortic valve regurgitation is  not visualized. No aortic stenosis is present.   6. The inferior vena cava is normal in size with greater than 50%  respiratory variability, suggesting right atrial pressure of 3 mmHg. "    She stopped working on 05/05/20 due to knee problems. She ill be starting with a new PMD.    Lisinopril was increased to 20 mg daily in December 2021.    Using inhaler as well for breathing.  As of December 2021, she continues to smoke. 1/2 ppd.    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   History of MI (myocardial infarction)    Hypertension Dx June 2016   Hypokalemia 12/26/2014   Myocardial infarction (HCC) 11/27/2014   stent placed   Osteoarthritis of both knees    Prediabetes    Smokes 1/2 pack per day 11/27/2014   Vitamin D deficiency     Past Surgical History:  Procedure Laterality Date   ABDOMINAL HYSTERECTOMY     CARDIAC CATHETERIZATION N/A 11/27/2014   Procedure: Left Heart Cath and Coronary Angiography;  Surgeon: Lyn Records, MD;  Location: [facility] INVASIVE CV LAB;  Service: Cardiovascular;  Laterality: N/A;   CARDIAC  CATHETERIZATION N/A 11/27/2014   Procedure: Coronary Balloon Angioplasty;  Surgeon: Lyn Records, MD;  Location: [facility] INVASIVE CV LAB;  Service: Cardiovascular;  Laterality: N/A;   TUBAL LIGATION       Current Outpatient Medications  Medication Sig Dispense Refill   aspirin EC 81 MG tablet Take 1 tablet (81 mg total) by mouth daily. 90 tablet 3   calcium-vitamin D (OSCAL WITH D) 500-200 MG-UNIT tablet Take 1 tablet by mouth.     carvedilol (COREG) 12.5 MG tablet Take 1 tablet (12.5 mg total) by mouth 2 (two) times daily with a meal. 60 tablet 5   lisinopril (ZESTRIL) 20 MG tablet Take  20 mg by mouth daily.     oxyCODONE-acetaminophen (PERCOCET/ROXICET) 5-325 MG tablet TAKE 1 2 TABLETS BY ORAL ROUTE DAILY AS NEEDED DX  M25.562     PROAIR HFA 108 (90 Base) MCG/ACT inhaler Inhale into the lungs.     rosuvastatin (CRESTOR) 20 MG tablet Take 1 tablet (20 mg total) by mouth daily. 90 tablet 3   No current facility-administered medications for this visit.    Allergies:   Patient has no known allergies.    Social History:  The patient  reports that she has been smoking cigarettes. She has a 10.00 pack-year smoking history. She has never used smokeless tobacco. She reports current alcohol use of about 3.0 standard drinks per week. She reports current drug use. Drug: Marijuana.   Family History:  The patient's ***family history includes Canavan disease in her father; Cancer in her father; Heart failure in her brother and mother.    ROS:  Please see the history of present illness.   Otherwise, review of systems are positive for ***.   All other systems are reviewed and negative.    PHYSICAL EXAM: VS:  There were no vitals taken for this visit. , BMI There is no height or weight on file to calculate BMI. GEN: Well nourished, well developed, in no acute distress HEENT: normal Neck: no JVD, carotid bruits, or masses Cardiac: ***RRR; no murmurs, rubs, or gallops,no edema  Respiratory:  clear to auscultation bilaterally, normal work of breathing GI: soft, nontender, nondistended, + BS MS: no deformity or atrophy Skin: warm and dry, no rash Neuro:  Strength and sensation are intact Psych: euthymic mood, full affect   EKG:   The ekg ordered today demonstrates ***   Recent Labs: No results found for requested labs within last 8760 hours.   Lipid Panel    Component Value Date/Time   CHOL 183 10/05/2019 0830   TRIG 73 10/05/2019 0830   HDL 55 10/05/2019 0830   CHOLHDL 3.3 10/05/2019 0830   CHOLHDL 3 01/09/2015 0743   VLDL 6.8 01/09/2015 0743   LDLCALC 114 (H)  10/05/2019 0830     Other studies Reviewed: Additional studies/ records that were reviewed today with results demonstrating: ***.   ASSESSMENT AND PLAN:  CAD: EF was quite low.  It was out of proportion to degree of CAD.  EF has improved. Hypertension: Hyperlipidemia: Tobacco abuse:   Current medicines are reviewed at length with the patient today.  The patient concerns regarding her medicines were addressed.  The following changes have been made:  No change***  Labs/ tests ordered today include: *** No orders of the defined types were placed in this encounter.   Recommend 150 minutes/week of aerobic exercise Low fat, low carb, high fiber diet recommended  Disposition:   FU in ***   Signed, Lance Muss,  MD  03/19/2021 9:18 AM    [facility] [address] [place], [place], [place]  [address] Phone: [phone]; Fax: [phone]

## 2021-03-20 ENCOUNTER — Ambulatory Visit: Payer: Medicaid Other | Admitting: Interventional Cardiology

## 2021-04-01 ENCOUNTER — Other Ambulatory Visit: Payer: Self-pay | Admitting: Family Medicine

## 2021-04-01 DIAGNOSIS — R911 Solitary pulmonary nodule: Secondary | ICD-10-CM

## 2021-06-04 NOTE — Progress Notes (Addendum)
Cardiology Office Note    Date:  06/17/2021   ID:  Breanna Khan, DOB [birth year], MRN [id number]   PCP:  Ellender Hose, NP   [facility]  Cardiologist:  Lance Muss, MD   Advanced Practice Provider:  No care team member to display Electrophysiologist:  None   [phone]   Chief Complaint  Patient presents with   Follow-up    History of Present Illness:  Breanna Khan is a 55 y.o. female  with history of CAD S/P MI 2016  scoring balloon angioplasty OM1,otherwise patent coronaries ICM EF 15-20%, f/u echo 2021 EF 50-55%, PVC's resolved with BB.  Last saw Dr. Katrinka Blazing 05/2020 and cleared for surgery, advised to quit smoking. She never had the surgery.  Patient comes in for f/u. She was diagnosed with COPD and was told she had pulmonary HTN and needs an echo. Trying to quit smoking but boyfriend and his daughter smoke in the house. Has had some heartburn in right chest after she lays down. No chest tightness or palpitations, edema. 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   History of MI (myocardial infarction)    Hypertension Dx June 2016   Hypokalemia 12/26/2014   Myocardial infarction (HCC) 11/27/2014   stent placed   Osteoarthritis of both knees    Prediabetes    Smokes 1/2 pack per day 11/27/2014   Vitamin D deficiency     Past Surgical History:  Procedure Laterality Date   ABDOMINAL HYSTERECTOMY     CARDIAC CATHETERIZATION N/A 11/27/2014   Procedure: Left Heart Cath and Coronary Angiography;  Surgeon: Lyn Records, MD;  Location: [facility] INVASIVE CV LAB;  Service: Cardiovascular;  Laterality: N/A;   CARDIAC CATHETERIZATION N/A 11/27/2014    Procedure: Coronary Balloon Angioplasty;  Surgeon: Lyn Records, MD;  Location: [facility] INVASIVE CV LAB;  Service: Cardiovascular;  Laterality: N/A;   TUBAL LIGATION      Current Medications: Current Meds  Medication Sig   aspirin EC 81 MG tablet Take 1 tablet (81 mg total) by mouth daily.   calcium-vitamin D (OSCAL WITH D) 500-200 MG-UNIT tablet Take 1 tablet by mouth.   carvedilol (COREG) 12.5 MG tablet Take 1 tablet (12.5 mg total) by mouth 2 (two) times daily with a meal.   lisinopril (ZESTRIL) 40 MG tablet Take 1 tablet (40 mg total) by mouth daily.   oxyCODONE-acetaminophen (PERCOCET/ROXICET) 5-325 MG tablet TAKE 1 2 TABLETS BY ORAL ROUTE DAILY AS NEEDED DX  M25.562   PROAIR HFA 108 (90 Base) MCG/ACT inhaler Inhale into the lungs.   rosuvastatin (CRESTOR) 20 MG tablet Take 1 tablet (20 mg total) by mouth daily.   SYMBICORT 160-4.5 MCG/ACT inhaler Inhale into the lungs.   [DISCONTINUED] lisinopril (ZESTRIL) 20 MG tablet Take 20 mg by mouth daily.     Allergies:   Patient has no known allergies.   Social History   Socioeconomic History   Marital status: Single    Spouse name: Not on file   Number of children: 2   Years of education: Not on file   Highest education level: Not on file  Occupational History   Not on file  Tobacco Use  Smoking status: Every Day    Packs/day: 0.50    Years: 20.00    Pack years: 10.00    Types: Cigarettes    Last attempt to quit: 11/25/2014    Years since quitting: 6.5   Smokeless tobacco: Never  Vaping Use   Vaping Use: Never used  Substance and Sexual Activity   Alcohol use: Yes    Alcohol/week: 3.0 standard drinks    Types: 1 Glasses of wine, 1 Cans of beer, 1 Shots of liquor per week    Comment: once weekly   Drug use: Yes    Types: Marijuana    Comment: occasionally   Sexual activity: Not on file  Other Topics Concern   Not on file  Social History Narrative   From [place]   Moved to [place] in 2012   Has 1 daughter who lives in  [place] (age 64) one son who lives in [place] (age 55)   Social Determinants of Health   Financial Resource Strain: Not on file  Food Insecurity: Not on file  Transportation Needs: Not on file  Physical Activity: Not on file  Stress: Not on file  Social Connections: Not on file     Family History:  The patient's  family history includes Canavan disease in her father; Cancer in her father; Heart failure in her brother and mother.   ROS:   Please see the history of present illness.    ROS All other systems reviewed and are negative.   PHYSICAL EXAM:   VS:  BP (!) 140/94    Pulse 94    Ht  (1.88 m)    Wt 228 lb 14.4 oz (103.8 kg)    SpO2 93%    BMI 29.39 kg/m   Physical Exam  GEN: Well nourished, well developed, in no acute distress  Neck: no JVD, carotid bruits, or masses Cardiac:RRR; no murmurs, rubs, or gallops  Respiratory:  clear to auscultation bilaterally, normal work of breathing GI: soft, nontender, nondistended, + BS Ext: without cyanosis, clubbing, or edema, Good distal pulses bilaterally Neuro:  Alert and Oriented x 3 Psych: euthymic mood, full affect  Wt Readings from Last 3 Encounters:  06/17/21 228 lb 14.4 oz (103.8 kg)  06/04/20 204 lb 4.8 oz (92.7 kg)  09/25/19 207 lb (93.9 kg)      Studies/Labs Reviewed:   EKG:  EKG is  ordered today.  The ekg ordered today demonstrates NSR with antlat TWI improved   Recent Labs: No results found for requested labs within last 8760 hours.   Lipid Panel    Component Value Date/Time   CHOL 183 10/05/2019 0830   TRIG 73 10/05/2019 0830   HDL 55 10/05/2019 0830   CHOLHDL 3.3 10/05/2019 0830   CHOLHDL 3 01/09/2015 0743   VLDL 6.8 01/09/2015 0743   LDLCALC 114 (H) 10/05/2019 0830    Additional studies/ records that were reviewed today include:    Echo 09/2019 IMPRESSIONS     1. Left ventricular ejection fraction, by estimation, is 50 to 55%. The  left ventricle has low normal function. The left ventricle  demonstrates  regional wall motion abnormalities; there is mild apical-mid inferolateral  hypokinesis. Left ventricular  diastolic parameters were normal.   2. Right ventricular systolic function is normal. The right ventricular  size is normal. There is normal pulmonary artery systolic pressure.   3. Left atrial size was mildly dilated.   4. The mitral valve is normal in structure. Trivial  mitral valve  regurgitation. No evidence of mitral stenosis.   5. The aortic valve is normal in structure. Aortic valve regurgitation is  not visualized. No aortic stenosis is present.   6. The inferior vena cava is normal in size with greater than 50%  respiratory variability, suggesting right atrial pressure of 3 mmHg.   Comparison(s): Prior images unable to be directly viewed, comparison made  by report only. The left ventricular wall motion abnormality is new.   Risk Assessment/Calculations:         ASSESSMENT:    1. Coronary artery disease involving native coronary artery of native heart without angina pectoris   2. Ischemic cardiomyopathy   3. Essential hypertension   4. Hyperlipidemia, unspecified hyperlipidemia type   5. Chronic obstructive pulmonary disease, unspecified COPD type (HCC)   6. Tobacco abuse      PLAN:  In order of problems listed above:  CAD S/P MI 2016  scoring balloon angioplasty OM1,otherwise patent coronaries-no angina   ICM EF 15-20%, f/u echo 2021 EF 50-55%, no CHF symptoms  HTN BP running high get labs from PCP and increase lisinopril and repeat bmet in 2 weeks. Addendum: labs received from PCP Crt 0.8-continue with current plan  HLD-LDL 90 04/2021 goal less than 70-increase crestor 40 mg daily and repeat FLP in 3 months  COPD & Tobacco abuse-told she had pulm HTN and needed an echo. No evidence of this on echo 10/05/19. Will repeat.  Obese-diet and exercise   Shared Decision Making/Informed Consent        Medication Adjustments/Labs and Tests  Ordered: Current medicines are reviewed at length with the patient today.  Concerns regarding medicines are outlined above.  Medication changes, Labs and Tests ordered today are listed in the Patient Instructions below. Patient Instructions  Medication Instructions:  Your physician has recommended you make the following change in your medication:   INCREASE the Lisinopril to 40 mg taking 1 daily .  You can take 2 of the 20 mg tablets at a time to use them up   *If you need a refill on your cardiac medications before your next appointment, please call your pharmacy*   Lab Work: None today, but would like for your Primary Care Dr draw a BMET when you see them in 2 weeks  If you have labs (blood work) drawn today and your tests are completely normal, you will receive your results only by: MyChart Message (if you have MyChart) OR A paper copy in the mail If you have any lab test that is abnormal or we need to change your treatment, we will call you to review the results.   Testing/Procedures: Your physician has requested that you have an echocardiogram. Echocardiography is a painless test that uses sound waves to create images of your heart. It provides your doctor with information about the size and shape of your heart and how well your hearts chambers and valves are working. This procedure takes approximately one hour. There are no restrictions for this procedure.    Follow-Up: At [facility], you and your health needs are our priority.  As part of our continuing mission to provide you with exceptional heart care, we have created designated Provider Care Teams.  These Care Teams include your primary Cardiologist (physician) and Advanced Practice Providers (APPs -  Physician Assistants and Nurse Practitioners) who all work together to provide you with the care you need, when you need it.  We recommend signing up for the patient portal  called "MyChart".  Sign up information is provided on  this After Visit Summary.  MyChart is used to connect with patients for Virtual Visits (Telemedicine).  Patients are able to view lab/test results, encounter notes, upcoming appointments, etc.  Non-urgent messages can be sent to your provider as well.   To learn more about what you can do with MyChart, go to [email or web address].    Your next appointment:   12 month(s)  The format for your next appointment:   In Person  Provider:   Lance Muss, MD     Other Instructions  Steps to Quit Smoking Smoking tobacco is the leading cause of preventable death. It can affect almost every organ in the body. Smoking puts you and those around you at risk for developing many serious chronic diseases. Quitting smoking can be difficult, but it is one of the best things that you can do for your health. It is never too late to quit. How do I get ready to quit? When you decide to quit smoking, create a plan to help you succeed. Before you quit: Pick a date to quit. Set a date within the next 2 weeks to give you time to prepare. Write down the reasons why you are quitting. Keep this list in places where you will see it often. Tell your family, friends, and co-workers that you are quitting. Support from your loved ones can make quitting easier. Talk with your health care provider about your options for quitting smoking. Find out what treatment options are covered by your health insurance. Identify people, places, things, and activities that make you want to smoke (triggers). Avoid them. What first steps can I take to quit smoking? Throw away all cigarettes at home, at work, and in your car. Throw away smoking accessories, such as Set designer. Clean your car. Make sure to empty the ashtray. Clean your home, including curtains and carpets. What strategies can I use to quit smoking? Talk with your health care provider about combining strategies, such as taking medicines while you are also  receiving in-person counseling. Using these two strategies together makes you more likely to succeed in quitting than if you used either strategy on its own. If you are pregnant or breastfeeding, talk with your health care provider about finding counseling or other support strategies to quit smoking. Do not take medicine to help you quit smoking unless your health care provider tells you to do so. To quit smoking: Quit right away Quit smoking completely, instead of gradually reducing how much you smoke over a period of time. Research shows that stopping smoking right away is more successful than gradually quitting. Attend in-person counseling to help you build problem-solving skills. You are more likely to succeed in quitting if you attend counseling sessions regularly. Even short sessions of 10 minutes can be effective. Take medicine You may take medicines to help you quit smoking. Some medicines require a prescription and some you can purchase over-the-counter. Medicines may have nicotine in them to replace the nicotine in cigarettes. Medicines may: Help to stop cravings. Help to relieve withdrawal symptoms. Your health care provider may recommend: Nicotine patches, gum, or lozenges. Nicotine inhalers or sprays. Non-nicotine medicine that is taken by mouth. Find resources Find resources and support systems that can help you to quit smoking and remain smoke-free after you quit. These resources are most helpful when you use them often. They include: Online chats with a Veterinary surgeon. Telephone quitlines. Printed Materials engineer. Support groups or group  counseling. Text messaging programs. Mobile phone apps or applications. Use apps that can help you stick to your quit plan by providing reminders, tips, and encouragement. There are many free apps for mobile devices as well as websites. Examples include Quit Guide from the [employer] and smokefree.gov What things can I do to make it easier to  quit?  Reach out to your family and friends for support and encouragement. Call telephone quitlines (1-800-QUIT-NOW), reach out to support groups, or work with a counselor for support. Ask people who smoke to avoid smoking around you. Avoid places that trigger you to smoke, such as bars, parties, or smoke-break areas at work. Spend time with people who do not smoke. Lessen the stress in your life. Stress can be a smoking trigger for some people. To lessen stress, try: Exercising regularly. Doing deep-breathing exercises. Doing yoga. Meditating. Performing a body scan. This involves closing your eyes, scanning your body from head to toe, and noticing which parts of your body are particularly tense. Try to relax the muscles in those areas. How will I feel when I quit smoking? Day 1 to 3 weeks Within the first 24 hours of quitting smoking, you may start to feel withdrawal symptoms. These symptoms are usually most noticeable 2-3 days after quitting, but they usually do not last for more than 2-3 weeks. You may experience these symptoms: Mood swings. Restlessness, anxiety, or irritability. Trouble concentrating. Dizziness. Strong cravings for sugary foods and nicotine. Mild weight gain. Constipation. Nausea. Coughing or a sore throat. Changes in how the medicines that you take for unrelated issues work in your body. Depression. Trouble sleeping (insomnia). Week 3 and afterward After the first 2-3 weeks of quitting, you may start to notice more positive results, such as: Improved sense of smell and taste. Decreased coughing and sore throat. Slower heart rate. Lower blood pressure. Clearer skin. The ability to breathe more easily. Fewer sick days. Quitting smoking can be very challenging. Do not get discouraged if you are not successful the first time. Some people need to make many attempts to quit before they achieve long-term success. Do your best to stick to your quit plan, and talk  with your health care provider if you have any questions or concerns. Summary Smoking tobacco is the leading cause of preventable death. Quitting smoking is one of the best things that you can do for your health. When you decide to quit smoking, create a plan to help you succeed. Quit smoking right away, not slowly over a period of time. When you start quitting, seek help from your health care provider, family, or friends. This information is not intended to replace advice given to you by your health care provider. Make sure you discuss any questions you have with your health care provider. Document Revised: 02/06/2021 Document Reviewed: 08/19/2018 Elsevier Patient Education  2022 Elsevier Inc.  Exercising to Lose Weight Getting regular exercise is important for everyone. It is especially important if you are overweight. Being overweight increases your risk of heart disease, stroke, diabetes, high blood pressure, and several types of cancer. Exercising, and reducing the calories you consume, can help you lose weight and improve fitness and health. Exercise can be moderate or vigorous intensity. To lose weight, most people need to do a certain amount of moderate or vigorous-intensity exercise each week. How can exercise affect me? You lose weight when you exercise enough to burn more calories than you eat. Exercise also reduces body fat and builds muscle. The more muscle  you have, the more calories you burn. Exercise also: Improves mood. Reduces stress and tension. Improves your overall fitness, flexibility, and endurance. Increases bone strength. Moderate-intensity exercise Moderate-intensity exercise is any activity that gets you moving enough to burn at least three times more energy (calories) than if you were sitting. Examples of moderate exercise include: Walking a mile in 15 minutes. Doing light yard work. Biking at an easy pace. Most people should get at least 150 minutes of  moderate-intensity exercise a week to maintain their body weight. Vigorous-intensity exercise Vigorous-intensity exercise is any activity that gets you moving enough to burn at least six times more calories than if you were sitting. When you exercise at this intensity, you should be working hard enough that you are not able to carry on a conversation. Examples of vigorous exercise include: Running. Playing a team sport, such as football, basketball, and soccer. Jumping rope. Most people should get at least 75 minutes a week of vigorous exercise to maintain their body weight. What actions can I take to lose weight? The amount of exercise you need to lose weight depends on: Your age. The type of exercise. Any health conditions you have. Your overall physical ability. Talk to your health care provider about how much exercise you need and what types of activities are safe for you. Nutrition  Make changes to your diet as told by your health care provider or diet and nutrition specialist (dietitian). This may include: Eating fewer calories. Eating more protein. Eating less unhealthy fats. Eating a diet that includes fresh fruits and vegetables, whole grains, low-fat dairy products, and lean protein. Avoiding foods with added fat, salt, and sugar. Drink plenty of water while you exercise to prevent dehydration or heat stroke. Activity Choose an activity that you enjoy and set realistic goals. Your health care provider can help you make an exercise plan that works for you. Exercise at a moderate or vigorous intensity most days of the week. The intensity of exercise may vary from person to person. You can tell how intense a workout is for you by paying attention to your breathing and heartbeat. Most people will notice their breathing and heartbeat get faster with more intense exercise. Do resistance training twice each week, such as: Push-ups. Sit-ups. Lifting weights. Using resistance  bands. Getting short amounts of exercise can be just as helpful as long, structured periods of exercise. If you have trouble finding time to exercise, try doing these things as part of your daily routine: Get up, stretch, and walk around every 30 minutes throughout the day. Go for a walk during your lunch break. Park your car farther away from your destination. If you take public transportation, get off one stop early and walk the rest of the way. Make phone calls while standing up and walking around. Take the stairs instead of elevators or escalators. Wear comfortable clothes and shoes with good support. Do not exercise so much that you hurt yourself, feel dizzy, or get very short of breath. Where to find more information [place].S. Department of Health and Human Services: ThisPath.fi Centers for Disease Control and Prevention: FootballExhibition.com.br Contact a health care provider: Before starting a new exercise program. If you have questions or concerns about your weight. If you have a medical problem that keeps you from exercising. Get help right away if: You have any of the following while exercising: Injury. Dizziness. Difficulty breathing or shortness of breath that does not go away when you stop exercising. Chest pain. Rapid  heartbeat. These symptoms may represent a serious problem that is an emergency. Do not wait to see if the symptoms will go away. Get medical help right away. Call your local emergency services (911 in the [place] Do not drive yourself to the hospital. Summary Getting regular exercise is especially important if you are overweight. Being overweight increases your risk of heart disease, stroke, diabetes, high blood pressure, and several types of cancer. Losing weight happens when you burn more calories than you eat. Reducing the amount of calories you eat, and getting regular moderate or vigorous exercise each week, helps you lose weight. This information is not intended to  replace advice given to you by your health care provider. Make sure you discuss any questions you have with your health care provider. Document Revised: 07/27/2020 Document Reviewed: 07/27/2020 Elsevier Patient Education  [address]    Signed, Jacolyn Reedy, [place]  06/17/2021 9:06 AM    [facility] [address] [place], [place], [place]  [address] Phone: [phone]; Fax: [phone]

## 2021-06-17 ENCOUNTER — Ambulatory Visit: Payer: Medicaid Other | Admitting: Physician Assistant

## 2021-06-17 ENCOUNTER — Encounter: Payer: Self-pay | Admitting: Physician Assistant

## 2021-06-17 ENCOUNTER — Other Ambulatory Visit: Payer: Self-pay

## 2021-06-17 VITALS — BP 140/94 | HR 94 | Ht 74.0 in | Wt 228.9 lb

## 2021-06-17 DIAGNOSIS — E785 Hyperlipidemia, unspecified: Secondary | ICD-10-CM | POA: Diagnosis not present

## 2021-06-17 DIAGNOSIS — I2510 Atherosclerotic heart disease of native coronary artery without angina pectoris: Secondary | ICD-10-CM

## 2021-06-17 DIAGNOSIS — I10 Essential (primary) hypertension: Secondary | ICD-10-CM

## 2021-06-17 DIAGNOSIS — I255 Ischemic cardiomyopathy: Secondary | ICD-10-CM

## 2021-06-17 DIAGNOSIS — J449 Chronic obstructive pulmonary disease, unspecified: Secondary | ICD-10-CM

## 2021-06-17 DIAGNOSIS — Z720 Tobacco use: Secondary | ICD-10-CM

## 2021-06-17 MED ORDER — LISINOPRIL 40 MG PO TABS: 40.0000 mg | ORAL_TABLET | Freq: Every day | ORAL | 3 refills | Status: AC

## 2021-06-17 MED ORDER — ROSUVASTATIN CALCIUM 40 MG PO TABS: 40.0000 mg | ORAL_TABLET | Freq: Every day | ORAL | 3 refills | Status: AC

## 2021-06-17 NOTE — Patient Instructions (Signed)
Medication Instructions:  Your physician has recommended you make the following change in your medication:   INCREASE the Lisinopril to 40 mg taking 1 daily .  You can take 2 of the 20 mg tablets at a time to use them up   *If you need a refill on your cardiac medications before your next appointment, please call your pharmacy*   Lab Work: None today, but would like for your Primary Care Dr draw a BMET when you see them in 2 weeks  If you have labs (blood work) drawn today and your tests are completely normal, you will receive your results only by: MyChart Message (if you have MyChart) OR A paper copy in the mail If you have any lab test that is abnormal or we need to change your treatment, we will call you to review the results.   Testing/Procedures: Your physician has requested that you have an echocardiogram. Echocardiography is a painless test that uses sound waves to create images of your heart. It provides your doctor with information about the size and shape of your heart and how well your hearts chambers and valves are working. This procedure takes approximately one hour. There are no restrictions for this procedure.    Follow-Up: At [facility], you and your health needs are our priority.  As part of our continuing mission to provide you with exceptional heart care, we have created designated Provider Care Teams.  These Care Teams include your primary Cardiologist (physician) and Advanced Practice Providers (APPs -  Physician Assistants and Nurse Practitioners) who all work together to provide you with the care you need, when you need it.  We recommend signing up for the patient portal called "MyChart".  Sign up information is provided on this After Visit Summary.  MyChart is used to connect with patients for Virtual Visits (Telemedicine).  Patients are able to view lab/test results, encounter notes, upcoming appointments, etc.  Non-urgent messages can be sent to your provider as  well.   To learn more about what you can do with MyChart, go to [email or web address].    Your next appointment:   12 month(s)  The format for your next appointment:   In Person  Provider:   Lance Muss, MD     Other Instructions  Steps to Quit Smoking Smoking tobacco is the leading cause of preventable death. It can affect almost every organ in the body. Smoking puts you and those around you at risk for developing many serious chronic diseases. Quitting smoking can be difficult, but it is one of the best things that you can do for your health. It is never too late to quit. How do I get ready to quit? When you decide to quit smoking, create a plan to help you succeed. Before you quit: Pick a date to quit. Set a date within the next 2 weeks to give you time to prepare. Write down the reasons why you are quitting. Keep this list in places where you will see it often. Tell your family, friends, and co-workers that you are quitting. Support from your loved ones can make quitting easier. Talk with your health care provider about your options for quitting smoking. Find out what treatment options are covered by your health insurance. Identify people, places, things, and activities that make you want to smoke (triggers). Avoid them. What first steps can I take to quit smoking? Throw away all cigarettes at home, at work, and in your car. Throw away smoking accessories, such  as Set designer. Clean your car. Make sure to empty the ashtray. Clean your home, including curtains and carpets. What strategies can I use to quit smoking? Talk with your health care provider about combining strategies, such as taking medicines while you are also receiving in-person counseling. Using these two strategies together makes you more likely to succeed in quitting than if you used either strategy on its own. If you are pregnant or breastfeeding, talk with your health care provider about finding  counseling or other support strategies to quit smoking. Do not take medicine to help you quit smoking unless your health care provider tells you to do so. To quit smoking: Quit right away Quit smoking completely, instead of gradually reducing how much you smoke over a period of time. Research shows that stopping smoking right away is more successful than gradually quitting. Attend in-person counseling to help you build problem-solving skills. You are more likely to succeed in quitting if you attend counseling sessions regularly. Even short sessions of 10 minutes can be effective. Take medicine You may take medicines to help you quit smoking. Some medicines require a prescription and some you can purchase over-the-counter. Medicines may have nicotine in them to replace the nicotine in cigarettes. Medicines may: Help to stop cravings. Help to relieve withdrawal symptoms. Your health care provider may recommend: Nicotine patches, gum, or lozenges. Nicotine inhalers or sprays. Non-nicotine medicine that is taken by mouth. Find resources Find resources and support systems that can help you to quit smoking and remain smoke-free after you quit. These resources are most helpful when you use them often. They include: Online chats with a Veterinary surgeon. Telephone quitlines. Printed Materials engineer. Support groups or group counseling. Text messaging programs. Mobile phone apps or applications. Use apps that can help you stick to your quit plan by providing reminders, tips, and encouragement. There are many free apps for mobile devices as well as websites. Examples include Quit Guide from the [employer] and smokefree.gov What things can I do to make it easier to quit?  Reach out to your family and friends for support and encouragement. Call telephone quitlines (1-800-QUIT-NOW), reach out to support groups, or work with a counselor for support. Ask people who smoke to avoid smoking around you. Avoid places that  trigger you to smoke, such as bars, parties, or smoke-break areas at work. Spend time with people who do not smoke. Lessen the stress in your life. Stress can be a smoking trigger for some people. To lessen stress, try: Exercising regularly. Doing deep-breathing exercises. Doing yoga. Meditating. Performing a body scan. This involves closing your eyes, scanning your body from head to toe, and noticing which parts of your body are particularly tense. Try to relax the muscles in those areas. How will I feel when I quit smoking? Day 1 to 3 weeks Within the first 24 hours of quitting smoking, you may start to feel withdrawal symptoms. These symptoms are usually most noticeable 2-3 days after quitting, but they usually do not last for more than 2-3 weeks. You may experience these symptoms: Mood swings. Restlessness, anxiety, or irritability. Trouble concentrating. Dizziness. Strong cravings for sugary foods and nicotine. Mild weight gain. Constipation. Nausea. Coughing or a sore throat. Changes in how the medicines that you take for unrelated issues work in your body. Depression. Trouble sleeping (insomnia). Week 3 and afterward After the first 2-3 weeks of quitting, you may start to notice more positive results, such as: Improved sense of smell and taste. Decreased  coughing and sore throat. Slower heart rate. Lower blood pressure. Clearer skin. The ability to breathe more easily. Fewer sick days. Quitting smoking can be very challenging. Do not get discouraged if you are not successful the first time. Some people need to make many attempts to quit before they achieve long-term success. Do your best to stick to your quit plan, and talk with your health care provider if you have any questions or concerns. Summary Smoking tobacco is the leading cause of preventable death. Quitting smoking is one of the best things that you can do for your health. When you decide to quit smoking, create a  plan to help you succeed. Quit smoking right away, not slowly over a period of time. When you start quitting, seek help from your health care provider, family, or friends. This information is not intended to replace advice given to you by your health care provider. Make sure you discuss any questions you have with your health care provider. Document Revised: 02/06/2021 Document Reviewed: 08/19/2018 Elsevier Patient Education  2022 Elsevier Inc.  Exercising to Lose Weight Getting regular exercise is important for everyone. It is especially important if you are overweight. Being overweight increases your risk of heart disease, stroke, diabetes, high blood pressure, and several types of cancer. Exercising, and reducing the calories you consume, can help you lose weight and improve fitness and health. Exercise can be moderate or vigorous intensity. To lose weight, most people need to do a certain amount of moderate or vigorous-intensity exercise each week. How can exercise affect me? You lose weight when you exercise enough to burn more calories than you eat. Exercise also reduces body fat and builds muscle. The more muscle you have, the more calories you burn. Exercise also: Improves mood. Reduces stress and tension. Improves your overall fitness, flexibility, and endurance. Increases bone strength. Moderate-intensity exercise Moderate-intensity exercise is any activity that gets you moving enough to burn at least three times more energy (calories) than if you were sitting. Examples of moderate exercise include: Walking a mile in 15 minutes. Doing light yard work. Biking at an easy pace. Most people should get at least 150 minutes of moderate-intensity exercise a week to maintain their body weight. Vigorous-intensity exercise Vigorous-intensity exercise is any activity that gets you moving enough to burn at least six times more calories than if you were sitting. When you exercise at this  intensity, you should be working hard enough that you are not able to carry on a conversation. Examples of vigorous exercise include: Running. Playing a team sport, such as football, basketball, and soccer. Jumping rope. Most people should get at least 75 minutes a week of vigorous exercise to maintain their body weight. What actions can I take to lose weight? The amount of exercise you need to lose weight depends on: Your age. The type of exercise. Any health conditions you have. Your overall physical ability. Talk to your health care provider about how much exercise you need and what types of activities are safe for you. Nutrition  Make changes to your diet as told by your health care provider or diet and nutrition specialist (dietitian). This may include: Eating fewer calories. Eating more protein. Eating less unhealthy fats. Eating a diet that includes fresh fruits and vegetables, whole grains, low-fat dairy products, and lean protein. Avoiding foods with added fat, salt, and sugar. Drink plenty of water while you exercise to prevent dehydration or heat stroke. Activity Choose an activity that you enjoy and set  realistic goals. Your health care provider can help you make an exercise plan that works for you. Exercise at a moderate or vigorous intensity most days of the week. The intensity of exercise may vary from person to person. You can tell how intense a workout is for you by paying attention to your breathing and heartbeat. Most people will notice their breathing and heartbeat get faster with more intense exercise. Do resistance training twice each week, such as: Push-ups. Sit-ups. Lifting weights. Using resistance bands. Getting short amounts of exercise can be just as helpful as long, structured periods of exercise. If you have trouble finding time to exercise, try doing these things as part of your daily routine: Get up, stretch, and walk around every 30 minutes throughout  the day. Go for a walk during your lunch break. Park your car farther away from your destination. If you take public transportation, get off one stop early and walk the rest of the way. Make phone calls while standing up and walking around. Take the stairs instead of elevators or escalators. Wear comfortable clothes and shoes with good support. Do not exercise so much that you hurt yourself, feel dizzy, or get very short of breath. Where to find more information [place] Department of Health and Human Services: ThisPath.fi Centers for Disease Control and Prevention: FootballExhibition.com.br Contact a health care provider: Before starting a new exercise program. If you have questions or concerns about your weight. If you have a medical problem that keeps you from exercising. Get help right away if: You have any of the following while exercising: Injury. Dizziness. Difficulty breathing or shortness of breath that does not go away when you stop exercising. Chest pain. Rapid heartbeat. These symptoms may represent a serious problem that is an emergency. Do not wait to see if the symptoms will go away. Get medical help right away. Call your local emergency services (911 in the [place] Do not drive yourself to the hospital. Summary Getting regular exercise is especially important if you are overweight. Being overweight increases your risk of heart disease, stroke, diabetes, high blood pressure, and several types of cancer. Losing weight happens when you burn more calories than you eat. Reducing the amount of calories you eat, and getting regular moderate or vigorous exercise each week, helps you lose weight. This information is not intended to replace advice given to you by your health care provider. Make sure you discuss any questions you have with your health care provider. Document Revised: 07/27/2020 Document Reviewed: 07/27/2020 Elsevier Patient Education  2022 [employer].

## 2021-06-17 NOTE — Addendum Note (Signed)
Addended by: Burnetta Sabin on: 06/17/2021 10:10 AM   Modules accepted: Orders

## 2021-06-23 ENCOUNTER — Ambulatory Visit: Payer: Medicaid Other | Admitting: Orthopaedic Surgery

## 2021-07-02 ENCOUNTER — Other Ambulatory Visit (HOSPITAL_COMMUNITY): Payer: Medicaid Other

## 2021-07-17 ENCOUNTER — Other Ambulatory Visit (HOSPITAL_COMMUNITY): Payer: Medicaid Other

## 2021-07-24 ENCOUNTER — Ambulatory Visit (HOSPITAL_COMMUNITY): Payer: Medicaid Other

## 2021-08-10 ENCOUNTER — Telehealth (HOSPITAL_COMMUNITY): Payer: Self-pay | Admitting: Physician Assistant

## 2021-08-10 NOTE — Telephone Encounter (Signed)
Patient called and cancelled echocardiogram and will call back at a later date to reschedule. See cancellations below:  patient will call back when she can come at a later date /LW patient has cancelled 07/02/21,07/17/21,07/24/21 amd 08/11/21   Order will be removed from the active Echo Wq.

## 2021-08-11 ENCOUNTER — Ambulatory Visit (HOSPITAL_COMMUNITY): Payer: Medicaid Other

## 2021-09-07 ENCOUNTER — Encounter (HOSPITAL_COMMUNITY): Payer: Self-pay | Admitting: Physician Assistant

## 2021-09-07 ENCOUNTER — Ambulatory Visit (HOSPITAL_COMMUNITY): Payer: Medicaid Other | Attending: Cardiology

## 2021-09-15 ENCOUNTER — Other Ambulatory Visit: Payer: Medicaid Other

## 2021-09-24 ENCOUNTER — Encounter (HOSPITAL_COMMUNITY): Payer: Self-pay | Admitting: Physician Assistant

## 2021-09-24 ENCOUNTER — Ambulatory Visit (HOSPITAL_COMMUNITY): Payer: Medicaid Other | Attending: Cardiology

## 2021-09-24 ENCOUNTER — Encounter (HOSPITAL_COMMUNITY): Payer: Self-pay

## 2021-09-24 NOTE — Progress Notes (Signed)
Verified appointment "no show" status with Foster Simpson at 13:21.  ?

## 2021-10-05 ENCOUNTER — Telehealth (HOSPITAL_COMMUNITY): Payer: Self-pay | Admitting: Physician Assistant

## 2021-10-05 NOTE — Telephone Encounter (Signed)
Just an FYI. ?We have made several attempts to contact this patient including sending a letter to schedule or reschedule their echocardiogram. We will be removing the patient from the echo WQ. ? ? ?09/24/21 NO SHOWED x 2 DO NOT RESCHEDULE AT [facility]  ?09/07/21 NO SHOWED _MAILED LETTER AND COPIED MD/LBW  ?patient will call back when she can come at a later date /LW  ?patient has cancelled 07/02/21,07/17/21,07/24/21 amd 08/11/21 ? ? ? ? ? ?Thank you  ?

## 2021-10-07 ENCOUNTER — Other Ambulatory Visit: Payer: Medicaid Other

## 2021-11-10 IMAGING — CT CT CHEST LUNG CANCER SCREENING LOW DOSE W/O CM
3 of 5 series · 13 of 40 positions shown, 14 images · non-contrast
Comparison: No priors.

CLINICAL DATA: 55-year-old female current smoker with 35 pack-year
history of smoking. Lung cancer screening examination.

EXAM:
CT CHEST WITHOUT CONTRAST LOW-DOSE FOR LUNG CANCER SCREENING
TECHNIQUE: Multidetector CT imaging of the chest was performed following the
standard protocol without IV contrast.

[Series 2: lung 5.00 br40 axial · axial · 0.55mm/px · z∈[-1231,-1121]mm · 2 of 67 slices shown, 3 images]
[im 23/67  mediastinal]
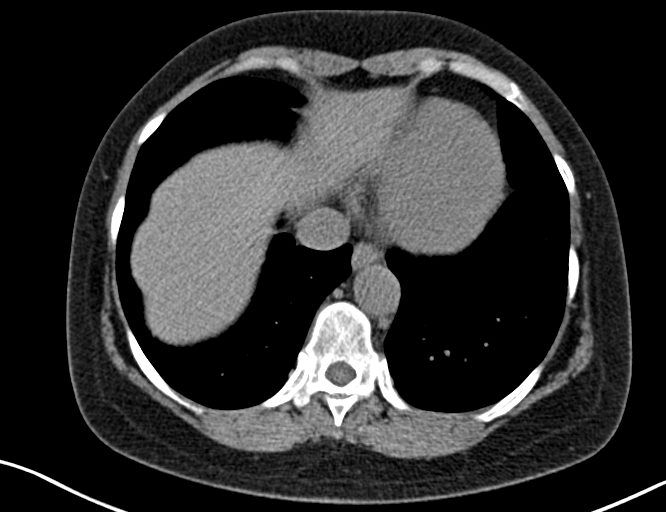
[im 23/67  lung]
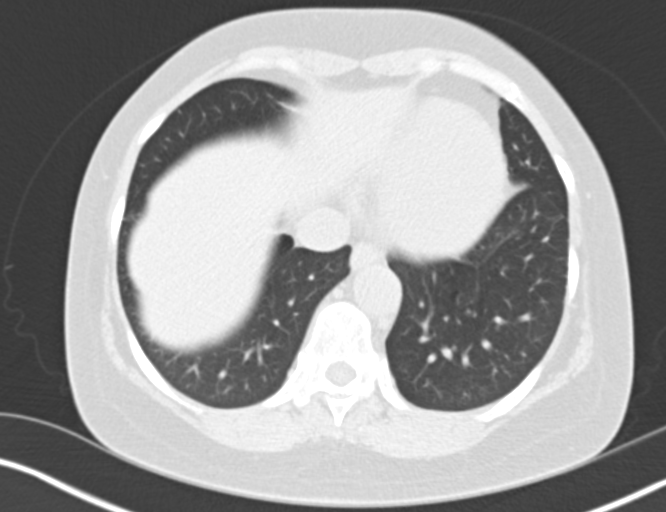
[im 45/67  lung]
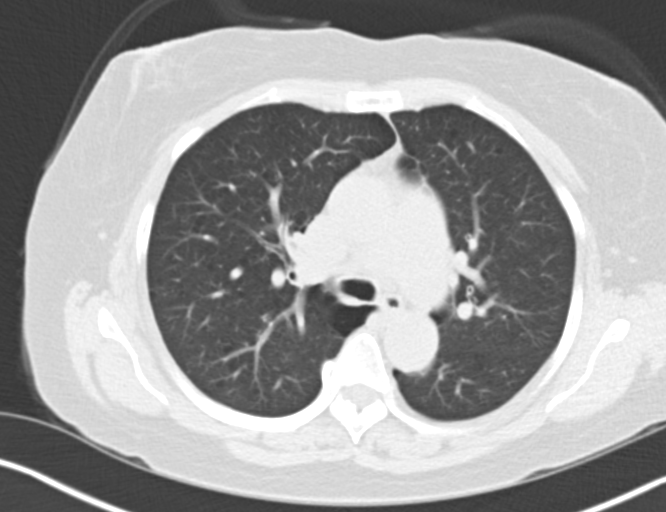

[Series 4: lung 1.00 br44 cor · coronal · 0.66mm/px · 3 of 328 slices shown]
[im 66/328  lung]
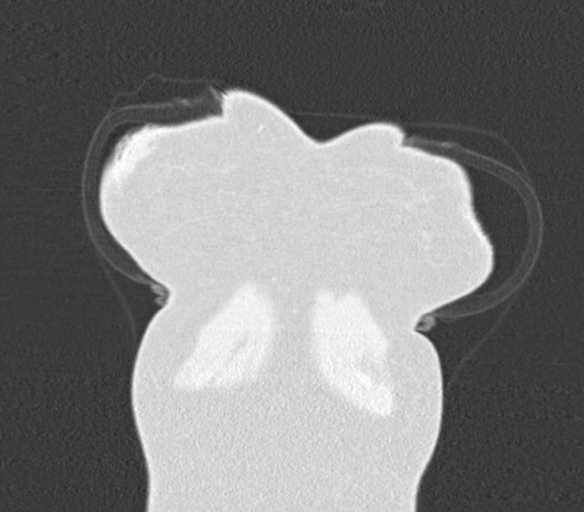
[im 131/328  lung]
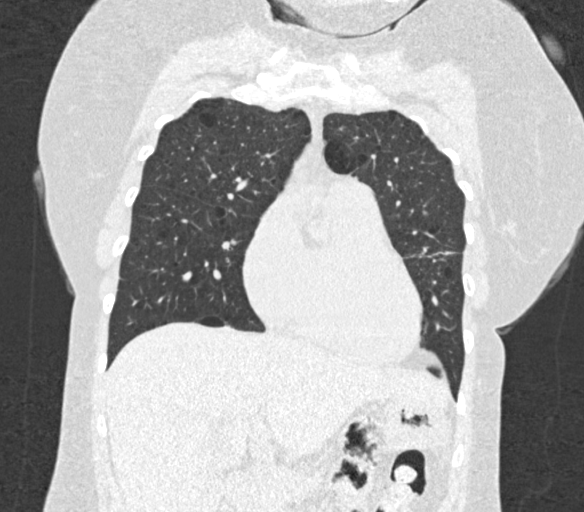
[im 197/328  lung]
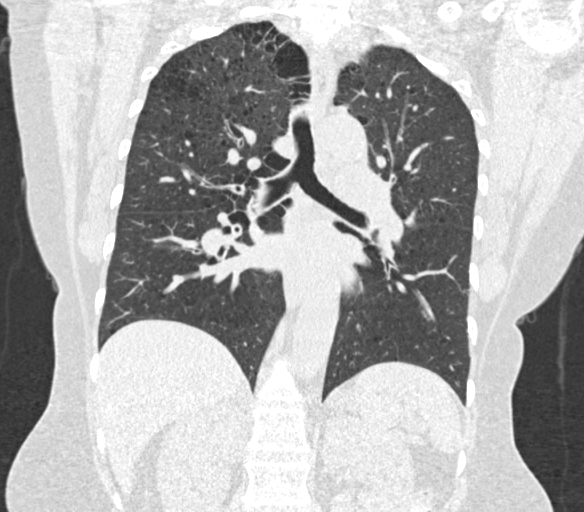

[Series 9: lung 1.00 br60 axial · axial · 0.71mm/px · z∈[-1282,-1040]mm · 8 of 296 slices shown]
[im 27/296  lung]
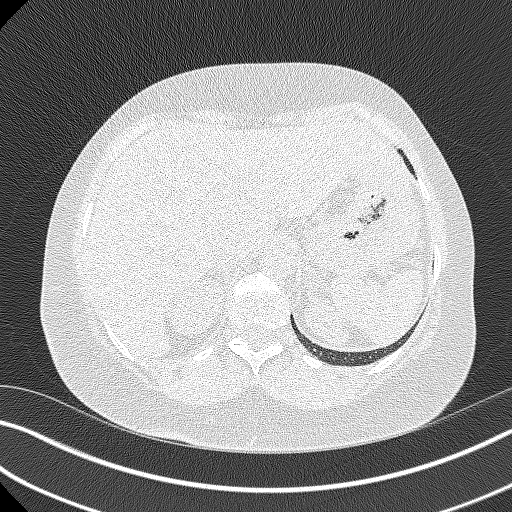
[im 54/296  lung]
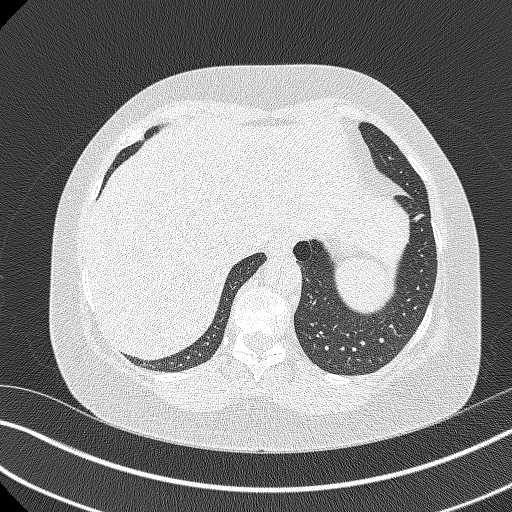
[im 94/296  lung]
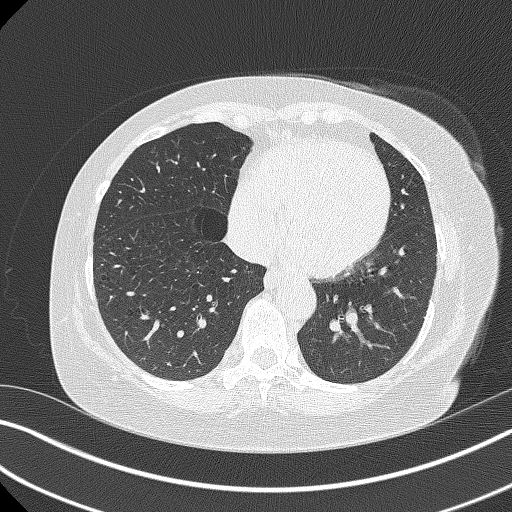
[im 135/296  lung]
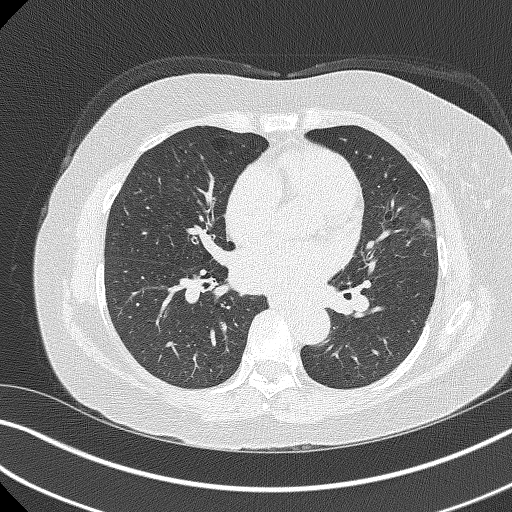
[im 161/296  lung]
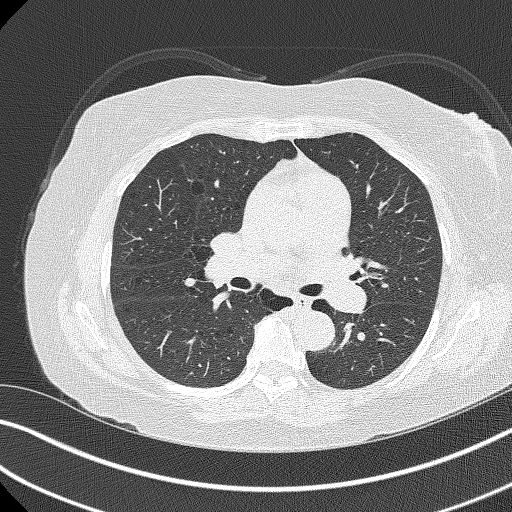
[im 202/296  lung]
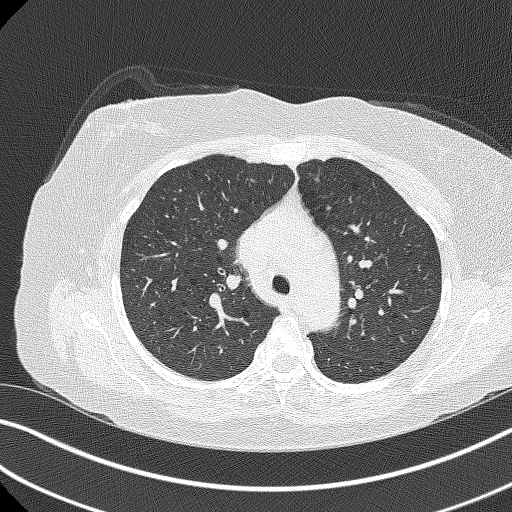
[im 242/296  lung]
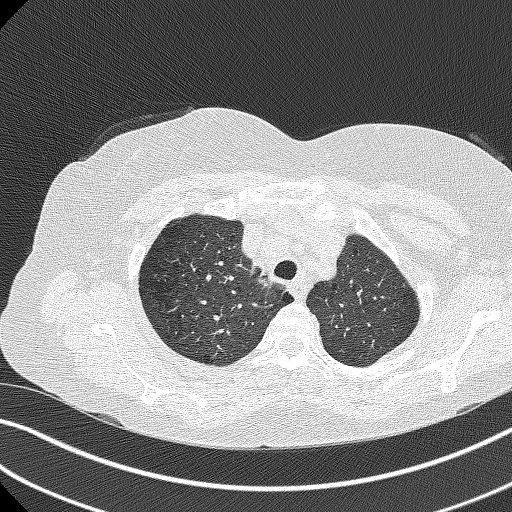
[im 269/296  lung]
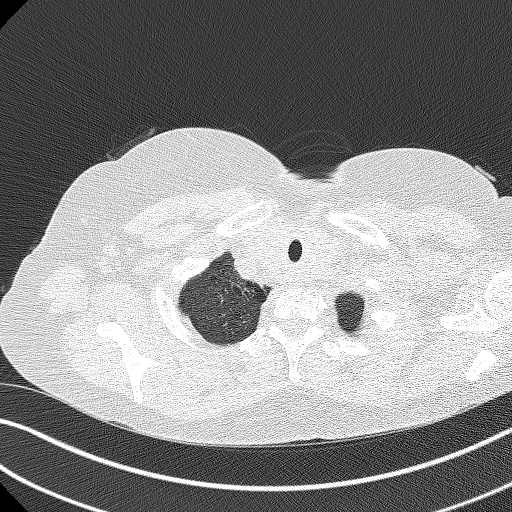

[13 of 40 positions shown; findings below may reference images not displayed]

FINDINGS: Cardiovascular: Heart size is normal. There is no significant
pericardial fluid, thickening or pericardial calcification. No
atherosclerotic calcifications are noted in the thoracic aorta or
the coronary arteries.

Mediastinum/Nodes: No pathologically enlarged mediastinal or hilar
lymph nodes. Please note that accurate exclusion of hilar adenopathy
is limited on noncontrast CT scans. Esophagus is unremarkable in
appearance. No axillary lymphadenopathy.

Lungs/Pleura: Several pulmonary nodules are scattered throughout the
lungs bilaterally, largest of which is in the lateral aspect of the
right lung intimately associated with the major fissure (axial image
185 of series 3), with a volume derived mean diameter of 7.3 mm. No
larger more suspicious appearing pulmonary nodules or masses are
noted. No acute consolidative airspace disease. No pleural
effusions. Diffuse bronchial wall thickening with moderate
centrilobular and paraseptal emphysema.

Upper Abdomen: Unremarkable.

Musculoskeletal: There are no aggressive appearing lytic or blastic
lesions noted in the visualized portions of the skeleton.
IMPRESSION: 1. Lung-RADS 3, probably benign findings. Short-term follow-up in 6
months is recommended with repeat low-dose chest CT without contrast
(please use the following order, "CT CHEST LCS NODULE FOLLOW-UP W/O
CM").
2. Mild diffuse bronchial wall thickening with moderate
centrilobular and paraseptal emphysema; imaging findings suggestive
of underlying COPD.

Emphysema ([id number]).

## 2022-08-04 ENCOUNTER — Ambulatory Visit: Payer: Medicaid Other | Admitting: Interventional Cardiology

## 2022-08-10 ENCOUNTER — Ambulatory Visit: Payer: Medicaid Other | Admitting: Physician Assistant

## 2022-12-15 ENCOUNTER — Inpatient Hospital Stay (HOSPITAL_COMMUNITY)
Admission: EM | Admit: 2022-12-15 | Discharge: 2023-01-13 | DRG: 296 | Disposition: E | Payer: 59 | Attending: Pulmonary Disease | Admitting: Pulmonary Disease

## 2022-12-15 ENCOUNTER — Emergency Department (HOSPITAL_COMMUNITY): Payer: 59

## 2022-12-15 DIAGNOSIS — Z87891 Personal history of nicotine dependence: Secondary | ICD-10-CM

## 2022-12-15 DIAGNOSIS — G931 Anoxic brain damage, not elsewhere classified: Secondary | ICD-10-CM | POA: Diagnosis present

## 2022-12-15 DIAGNOSIS — G935 Compression of brain: Secondary | ICD-10-CM | POA: Diagnosis present

## 2022-12-15 DIAGNOSIS — R680 Hypothermia, not associated with low environmental temperature: Secondary | ICD-10-CM | POA: Diagnosis present

## 2022-12-15 DIAGNOSIS — Z79899 Other long term (current) drug therapy: Secondary | ICD-10-CM

## 2022-12-15 DIAGNOSIS — G936 Cerebral edema: Secondary | ICD-10-CM | POA: Diagnosis present

## 2022-12-15 DIAGNOSIS — E874 Mixed disorder of acid-base balance: Secondary | ICD-10-CM | POA: Diagnosis present

## 2022-12-15 DIAGNOSIS — E8729 Other acidosis: Secondary | ICD-10-CM

## 2022-12-15 DIAGNOSIS — R34 Anuria and oliguria: Secondary | ICD-10-CM | POA: Diagnosis present

## 2022-12-15 DIAGNOSIS — D72829 Elevated white blood cell count, unspecified: Secondary | ICD-10-CM | POA: Diagnosis present

## 2022-12-15 DIAGNOSIS — Z9911 Dependence on respirator [ventilator] status: Secondary | ICD-10-CM

## 2022-12-15 DIAGNOSIS — Z66 Do not resuscitate: Secondary | ICD-10-CM | POA: Diagnosis present

## 2022-12-15 DIAGNOSIS — R000 Tachycardia, unspecified: Secondary | ICD-10-CM | POA: Diagnosis present

## 2022-12-15 DIAGNOSIS — Z6834 Body mass index (BMI) 34.0-34.9, adult: Secondary | ICD-10-CM

## 2022-12-15 DIAGNOSIS — I469 Cardiac arrest, cause unspecified: Principal | ICD-10-CM | POA: Diagnosis present

## 2022-12-15 DIAGNOSIS — E875 Hyperkalemia: Secondary | ICD-10-CM | POA: Diagnosis present

## 2022-12-15 DIAGNOSIS — Z515 Encounter for palliative care: Secondary | ICD-10-CM

## 2022-12-15 DIAGNOSIS — I2510 Atherosclerotic heart disease of native coronary artery without angina pectoris: Secondary | ICD-10-CM | POA: Diagnosis present

## 2022-12-15 DIAGNOSIS — G8929 Other chronic pain: Secondary | ICD-10-CM | POA: Diagnosis present

## 2022-12-15 DIAGNOSIS — R7989 Other specified abnormal findings of blood chemistry: Secondary | ICD-10-CM | POA: Diagnosis present

## 2022-12-15 DIAGNOSIS — Z955 Presence of coronary angioplasty implant and graft: Secondary | ICD-10-CM

## 2022-12-15 DIAGNOSIS — G9341 Metabolic encephalopathy: Secondary | ICD-10-CM | POA: Diagnosis present

## 2022-12-15 DIAGNOSIS — J9690 Respiratory failure, unspecified, unspecified whether with hypoxia or hypercapnia: Secondary | ICD-10-CM | POA: Diagnosis present

## 2022-12-15 DIAGNOSIS — E669 Obesity, unspecified: Secondary | ICD-10-CM | POA: Diagnosis present

## 2022-12-15 DIAGNOSIS — N179 Acute kidney failure, unspecified: Secondary | ICD-10-CM | POA: Diagnosis present

## 2022-12-15 DIAGNOSIS — K7200 Acute and subacute hepatic failure without coma: Secondary | ICD-10-CM | POA: Diagnosis present

## 2022-12-15 DIAGNOSIS — R739 Hyperglycemia, unspecified: Secondary | ICD-10-CM | POA: Diagnosis present

## 2022-12-15 DIAGNOSIS — R579 Shock, unspecified: Secondary | ICD-10-CM | POA: Diagnosis present

## 2022-12-15 LAB — I-STAT ARTERIAL BLOOD GAS, ED
Acid-base deficit: 17 mmol/L — ABNORMAL HIGH (ref 0.0–2.0)
Bicarbonate: 16.7 mmol/L — ABNORMAL LOW (ref 20.0–28.0)
Calcium, Ion: 1.83 mmol/L (ref 1.15–1.40)
HCT: 35 % — ABNORMAL LOW (ref 36.0–46.0)
Hemoglobin: 11.9 g/dL — ABNORMAL LOW (ref 12.0–15.0)
O2 Saturation: 100 %
Potassium: 7.1 mmol/L (ref 3.5–5.1)
Sodium: 137 mmol/L (ref 135–145)
TCO2: 19 mmol/L — ABNORMAL LOW (ref 22–32)
pCO2 arterial: 88.7 mmHg (ref 32–48)
pH, Arterial: 6.882 — CL (ref 7.35–7.45)
pO2, Arterial: 385 mmHg — ABNORMAL HIGH (ref 83–108)

## 2022-12-15 LAB — LACTIC ACID, PLASMA: Lactic Acid, Venous: >9 mmol/L (ref 0.5–1.9)

## 2022-12-15 LAB — BASIC METABOLIC PANEL
Anion gap: 17 — ABNORMAL HIGH (ref 5–15)
BUN: 16 mg/dL (ref 6–20)
CO2: 16 mmol/L — ABNORMAL LOW (ref 22–32)
Calcium: 8.3 mg/dL — ABNORMAL LOW (ref 8.9–10.3)
Chloride: 106 mmol/L (ref 98–111)
Creatinine, Ser: 1.61 mg/dL — ABNORMAL HIGH (ref 0.44–1.00)
GFR, Estimated: 37 mL/min — ABNORMAL LOW (ref >60)
Glucose, Bld: 228 mg/dL — ABNORMAL HIGH (ref 70–99)
Potassium: 5.9 mmol/L — ABNORMAL HIGH (ref 3.5–5.1)
Sodium: 139 mmol/L (ref 135–145)

## 2022-12-15 LAB — CBC
HCT: 43.5 % (ref 36.0–46.0)
Hemoglobin: 12.9 g/dL (ref 12.0–15.0)
MCH: 32 pg (ref 26.0–34.0)
MCHC: 29.7 g/dL — ABNORMAL LOW (ref 30.0–36.0)
MCV: 107.9 fL — ABNORMAL HIGH (ref 80.0–100.0)
Platelets: 216 10*3/uL (ref 150–400)
RBC: 4.03 MIL/uL (ref 3.87–5.11)
RDW: 13.8 % (ref 11.5–15.5)
WBC: 9.7 10*3/uL (ref 4.0–10.5)
nRBC: 0.3 % — ABNORMAL HIGH (ref 0.0–0.2)

## 2022-12-15 LAB — MAGNESIUM: Magnesium: 2.6 mg/dL — ABNORMAL HIGH (ref 1.7–2.4)

## 2022-12-15 LAB — TROPONIN I (HIGH SENSITIVITY): Troponin I (High Sensitivity): 11 ng/L (ref <18)

## 2022-12-15 MED ORDER — SODIUM CHLORIDE 0.9 % IV SOLN
2.0000 g | Freq: Once | INTRAVENOUS | Status: AC
  Administered 2022-12-16: 2 g via INTRAVENOUS
  Filled 2022-12-15: qty 12.5

## 2022-12-15 MED ORDER — SODIUM BICARBONATE 8.4 % IV SOLN
50.0000 meq | Freq: Once | INTRAVENOUS | Status: AC
  Administered 2022-12-15: 50 meq via INTRAVENOUS
  Filled 2022-12-15: qty 50

## 2022-12-15 MED ORDER — EPINEPHRINE HCL 5 MG/250ML IV SOLN IN NS
0.5000 ug/min | INTRAVENOUS | Status: DC
  Filled 2022-12-15: qty 250

## 2022-12-15 MED ORDER — LACTATED RINGERS IV SOLN
INTRAVENOUS | Status: DC
  Administered 2022-12-16: 150 mL via INTRAVENOUS

## 2022-12-15 MED ORDER — NALOXONE HCL 2 MG/2ML IJ SOSY
PREFILLED_SYRINGE | INTRAMUSCULAR | Status: AC
  Filled 2022-12-15: qty 2

## 2022-12-15 MED ORDER — EPINEPHRINE 1 MG/10ML IJ SOSY
PREFILLED_SYRINGE | INTRAMUSCULAR | Status: AC | PRN
  Administered 2022-12-15: 1 mg via INTRAVENOUS

## 2022-12-15 MED ORDER — LACTATED RINGERS IV BOLUS (SEPSIS)
1000.0000 mL | Freq: Once | INTRAVENOUS | Status: AC
  Administered 2022-12-16: 1000 mL via INTRAVENOUS

## 2022-12-15 MED ORDER — LACTATED RINGERS IV BOLUS (SEPSIS)
250.0000 mL | Freq: Once | INTRAVENOUS | Status: AC
  Administered 2022-12-16: 250 mL via INTRAVENOUS

## 2022-12-15 MED ORDER — VANCOMYCIN HCL IN DEXTROSE 1-5 GM/200ML-% IV SOLN
1000.0000 mg | Freq: Once | INTRAVENOUS | Status: AC
  Administered 2022-12-16: 1000 mg via INTRAVENOUS
  Filled 2022-12-15: qty 200

## 2022-12-15 MED ORDER — NOREPINEPHRINE 4 MG/250ML-% IV SOLN
0.0000 ug/min | INTRAVENOUS | Status: DC
  Administered 2022-12-15: 5 ug/min via INTRAVENOUS
  Administered 2022-12-16: 10 ug/min via INTRAVENOUS
  Filled 2022-12-15 (×2): qty 250

## 2022-12-15 MED ORDER — NALOXONE HCL 0.4 MG/ML IJ SOLN
0.4000 mg | Freq: Once | INTRAMUSCULAR | Status: AC
  Administered 2022-12-15: 0.4 mg via INTRAVENOUS

## 2022-12-15 MED ORDER — VASOPRESSIN 20 UNITS/100 ML INFUSION FOR SHOCK
INTRAVENOUS | Status: AC
  Administered 2022-12-15: 20 [IU]
  Filled 2022-12-15: qty 100

## 2022-12-15 MED ORDER — SODIUM BICARBONATE 8.4 % IV SOLN
INTRAVENOUS | Status: DC
  Filled 2022-12-15: qty 1000

## 2022-12-15 MED ORDER — CALCIUM CHLORIDE 10 % IV SOLN
INTRAVENOUS | Status: AC | PRN
  Administered 2022-12-15: 1 g via INTRAVENOUS

## 2022-12-15 MED ORDER — EPINEPHRINE HCL 5 MG/250ML IV SOLN IN NS
INTRAVENOUS | Status: AC
  Administered 2022-12-15: 10 ug/min via INTRAVENOUS
  Filled 2022-12-15: qty 250

## 2022-12-15 MED ORDER — METRONIDAZOLE 500 MG/100ML IV SOLN
500.0000 mg | Freq: Once | INTRAVENOUS | Status: AC
  Administered 2022-12-16: 500 mg via INTRAVENOUS
  Filled 2022-12-15: qty 100

## 2022-12-15 NOTE — ED Provider Notes (Signed)
[place] EMERGENCY DEPARTMENT AT [facility] Provider Note   CSN: [id number] Arrival date & time: 12/15/22  2243     History  Chief Complaint  Patient presents with   Cardiac Arrest    Breanna Khan is a 57 y.o. female.   Cardiac Arrest    Patient has a history of coronary artery disease, tobacco useStatus postcardiac stenting.  According to the patient's family they were speaking to her about 30 minutes prior to her arrest.  When he went back into the room she was unresponsive.  There was no bystander CPR.  EMS found the patient in asystole.  She may have been down 20 minutes before EMS.  She was given 6 rounds of epinephrine and had ROSC.  Patient went back in and out of cardiac arrest 2 times with EMS.  Patient arrives in the [facility] undergoing cardiac compressions  Home Medications Prior to Admission medications   Not on File      Allergies    Patient has no known allergies.    Review of Systems   Review of Systems  Physical Exam Updated Vital Signs BP (!) 105/58   Pulse 78   Temp (!) 95.2 F (35.1 C) (Bladder)   Resp (!) 26   SpO2 100%  Physical Exam Constitutional:      General: She is in acute distress.     Appearance: She is ill-appearing.  HENT:     Head: Normocephalic and atraumatic.     Nose: Nose normal.     Mouth/Throat:     Mouth: Mucous membranes are moist.     Pharynx: No oropharyngeal exudate or posterior oropharyngeal erythema.  Cardiovascular:     Rate and Rhythm: Bradycardia present.  Pulmonary:     Comments: Good breath sounds with bag-valve-mask ventilation Musculoskeletal:        General: No swelling.     Right lower leg: No edema.     Left lower leg: No edema.  Skin:    Findings: No lesion or rash.  Neurological:     GCS: GCS eye subscore is 1. GCS verbal subscore is 1. GCS motor subscore is 1.     ED Results / Procedures / Treatments   Labs (all labs ordered are listed, but only abnormal results are  displayed) Labs Reviewed  BASIC METABOLIC PANEL - Abnormal; Notable for the following components:      Result Value   Potassium 5.9 (*)    CO2 16 (*)    Glucose, Bld 228 (*)    Creatinine, Ser 1.61 (*)    Calcium 8.3 (*)    GFR, Estimated 37 (*)    Anion gap 17 (*)    All other components within normal limits  CBC - Abnormal; Notable for the following components:   MCV 107.9 (*)    MCHC 29.7 (*)    nRBC 0.3 (*)    All other components within normal limits  LACTIC ACID, PLASMA - Abnormal; Notable for the following components:   Lactic Acid, Venous >9.0 (*)    All other components within normal limits  MAGNESIUM - Abnormal; Notable for the following components:   Magnesium 2.6 (*)    All other components within normal limits  I-STAT ARTERIAL BLOOD GAS, ED - Abnormal; Notable for the following components:   pH, Arterial 6.882 (*)    pCO2 arterial 88.7 (*)    pO2, Arterial 385 (*)    Bicarbonate 16.7 (*)    TCO2 19 (*)  Acid-base deficit 17.0 (*)    Potassium 7.1 (*)    Calcium, Ion 1.83 (*)    HCT 35.0 (*)    Hemoglobin 11.9 (*)    All other components within normal limits  LACTIC ACID, PLASMA  TROPONIN I (HIGH SENSITIVITY)    EKG EKG Interpretation Date/Time:  Wednesday December 15 2022 22:45:24 EDT Ventricular Rate:  79 PR Interval:  118 QRS Duration:  80 QT Interval:  268 QTC Calculation: 308 R Axis:   158  Text Interpretation: Ectopic atrial rhythm Borderline short PR interval Posterior infarct, acute (LCx) Anteroseptal infarct, age indeterminate Confirmed by Linwood Dibbles [id number]) on 12/15/2022 10:52:56 PM  Radiology DG Chest Portable 1 View  Result Date: 12/15/2022 CLINICAL DATA:  CPR EXAM: PORTABLE CHEST 1 VIEW COMPARISON:  None Available. FINDINGS: Endotracheal tube tip is about 4.2 cm superior to the carina. Mild patchy basilar opacities. No pleural effusion. Cardiomediastinal silhouette within normal limits. No pneumothorax. IMPRESSION: 1. Endotracheal tube tip  about 4.2 cm superior to the carina. 2. Mild patchy basilar opacities, possible, mild infiltrate, or aspiration Electronically Signed   By: Jasmine Pang M.D.   On: 12/15/2022 22:58    Procedures .Critical Care  Performed by: Linwood Dibbles, MD Authorized by: Linwood Dibbles, MD   Critical care provider statement:    Critical care time (minutes):  30   Critical care was time spent personally by me on the following activities:  Development of treatment plan with patient or surrogate, discussions with consultants, evaluation of patient's response to treatment, examination of patient, ordering and review of laboratory studies, ordering and review of radiographic studies, ordering and performing treatments and interventions, pulse oximetry, re-evaluation of patient's condition and review of old charts Date/Time: 12/15/2022 11:51 PM  Performed by: Linwood Dibbles, MDPre-anesthesia Checklist: Suction available, Emergency Drugs available, Timeout performed, Patient being monitored and Patient identified Oxygen Delivery Method: Non-rebreather mask Induction Type: Rapid sequence Laryngoscope Size: Glidescope Tube size: 7.5 mm Number of attempts: 1 Airway Equipment and Method: Video-laryngoscopy Placement Confirmation: Positive ETCO2 and ETT inserted through vocal cords under direct vision Dental Injury: Teeth and Oropharynx as per pre-operative assessment         Medications Ordered in ED Medications  norepinephrine (LEVOPHED) 4mg  in (0.016 mg/mL) premix infusion (30 mcg/min Intravenous Rate/Dose Change 12/15/22 2317)  EPINEPHrine (ADRENALIN) 5 mg in NS 250 mL (0.02 mg/mL) premix infusion (20 mcg/min Intravenous Rate/Dose Change 12/15/22 2316)  naloxone (NARCAN) 2 MG/2ML injection (  Canceled Entry 12/15/22 2344)  sodium bicarbonate 150 mEq in dextrose 5 % 1,150 mL infusion (has no administration in time range)  lactated ringers infusion (has no administration in time range)  lactated ringers bolus 1,000 mL  (has no administration in time range)    And  lactated ringers bolus 1,000 mL (has no administration in time range)    And  lactated ringers bolus 250 mL (has no administration in time range)  ceFEPIme (MAXIPIME) 2 g in sodium chloride 0.9 % 100 mL IVPB (has no administration in time range)  metroNIDAZOLE (FLAGYL) IVPB 500 mg (has no administration in time range)  vancomycin (VANCOCIN) IVPB 1000 mg/200 mL premix (has no administration in time range)  EPINEPHrine (ADRENALIN) 1 MG/10ML injection (1 mg Intravenous Given 12/15/22 2241)  EPINEPHrine (ADRENALIN) 1 MG/10ML injection (1 mg Intravenous Given 12/15/22 2304)  calcium chloride injection (1 g Intravenous Given 12/15/22 2305)  naloxone [facility]) injection 0.4 mg (0.4 mg Intravenous Given 12/15/22 2329)  vasopressin 20 units/100 mL infusion SOLN (20  Units  New Bag/Given 12/15/22 2326)  sodium bicarbonate injection 50 mEq (50 mEq Intravenous Given 12/15/22 2332)    ED Course/ Medical Decision Making/ A&P Clinical Course as of 12/15/22 2355  Wed Dec 15, 2022  2324 ABG shows resp acidosis [JK]  2328 Bicarb ordered [JK]  2339 Heart rate in the 70s now.  Maps greater than 70.  [JK]  2340 CBC(!) CBC normal [JK]    Clinical Course User Index [JK] Linwood Dibbles, MD                             Medical Decision Making Differential diagnosis includes but not limited to cardiac dysrhythmia acute coronary syndrome, pulmonary embolism, Cerebral  Problems Addressed: Cardiac arrest [facility]): acute illness or injury that poses a threat to life or bodily functions Respiratory acidosis: acute illness or injury that poses a threat to life or bodily functions  Amount and/or Complexity of Data Reviewed Labs: ordered. Decision-making details documented in ED Course. Radiology: ordered and independent interpretation performed.  Risk Prescription drug management.   Patient presented in cardiac arrest.  While in the ED patient had another episode of cardiac  arrest with absent pulses.  She was in a PEA rhythm.  Patient underwent CPR with epinephrine with return of ROSC.  Patient has also been given calcium and bicarb.  Her initial labs show severe respiratory acidosis.  I have ordered bicarb.  Patient has noted to have severely elevated lactic acid level.  Suspect this is related to her downtime.  Will empirically treat with antibiotics although no signs of infection at this time.  Case was discussed with cardiology.  No indication for cardiac intervention at this time.  I consulted with pulmonary critical care and will admit for further treatment and evaluation        Final Clinical Impression(s) / ED Diagnoses Final diagnoses:  Cardiac arrest [facility])  Respiratory acidosis    Rx / DC Orders ED Discharge Orders     None         Linwood Dibbles, MD 12/15/22 2355

## 2022-12-15 NOTE — ED Notes (Signed)
Pt intubated, positive color change, bilateral breath sounds noted

## 2022-12-15 NOTE — Code Documentation (Signed)
Pulses have returned per MD

## 2022-12-15 NOTE — ED Triage Notes (Signed)
Pt arrives to ED CPR. Pt possibly down 20 min before EMS arrival pt given 6 total epi received ROSC then continued compressions before arrival to ED. 33 total mins of CPR per EMS

## 2022-12-15 NOTE — Progress Notes (Signed)
Asystole cardiac arrest with off and on CPR for close to 30 min with intermittent ROSC. Initial EKG with no STEMI. Subsequent post arrest EKG's with diffuse ST depression, no STEMI. I do not think patient will benefit from urgent coronary angiography. Supportive management as per ER/CCM.   Elder Negus, MD Pager: [phone] Office: [phone]

## 2022-12-16 ENCOUNTER — Inpatient Hospital Stay (HOSPITAL_COMMUNITY): Payer: 59

## 2022-12-16 DIAGNOSIS — R34 Anuria and oliguria: Secondary | ICD-10-CM | POA: Diagnosis present

## 2022-12-16 DIAGNOSIS — G8929 Other chronic pain: Secondary | ICD-10-CM | POA: Diagnosis present

## 2022-12-16 DIAGNOSIS — I469 Cardiac arrest, cause unspecified: Secondary | ICD-10-CM | POA: Diagnosis present

## 2022-12-16 DIAGNOSIS — E8729 Other acidosis: Secondary | ICD-10-CM | POA: Diagnosis not present

## 2022-12-16 DIAGNOSIS — G931 Anoxic brain damage, not elsewhere classified: Secondary | ICD-10-CM | POA: Diagnosis present

## 2022-12-16 DIAGNOSIS — G936 Cerebral edema: Secondary | ICD-10-CM | POA: Diagnosis present

## 2022-12-16 DIAGNOSIS — N179 Acute kidney failure, unspecified: Secondary | ICD-10-CM | POA: Diagnosis present

## 2022-12-16 DIAGNOSIS — Z66 Do not resuscitate: Secondary | ICD-10-CM | POA: Diagnosis present

## 2022-12-16 DIAGNOSIS — D72829 Elevated white blood cell count, unspecified: Secondary | ICD-10-CM

## 2022-12-16 DIAGNOSIS — R579 Shock, unspecified: Secondary | ICD-10-CM | POA: Diagnosis present

## 2022-12-16 DIAGNOSIS — K7200 Acute and subacute hepatic failure without coma: Secondary | ICD-10-CM

## 2022-12-16 DIAGNOSIS — J9690 Respiratory failure, unspecified, unspecified whether with hypoxia or hypercapnia: Secondary | ICD-10-CM | POA: Diagnosis present

## 2022-12-16 DIAGNOSIS — G9341 Metabolic encephalopathy: Secondary | ICD-10-CM | POA: Diagnosis present

## 2022-12-16 DIAGNOSIS — Z87891 Personal history of nicotine dependence: Secondary | ICD-10-CM | POA: Diagnosis not present

## 2022-12-16 DIAGNOSIS — R7989 Other specified abnormal findings of blood chemistry: Secondary | ICD-10-CM | POA: Diagnosis present

## 2022-12-16 DIAGNOSIS — E669 Obesity, unspecified: Secondary | ICD-10-CM | POA: Diagnosis present

## 2022-12-16 DIAGNOSIS — Z9911 Dependence on respirator [ventilator] status: Secondary | ICD-10-CM | POA: Diagnosis not present

## 2022-12-16 DIAGNOSIS — Z7189 Other specified counseling: Secondary | ICD-10-CM

## 2022-12-16 DIAGNOSIS — G935 Compression of brain: Secondary | ICD-10-CM | POA: Diagnosis present

## 2022-12-16 DIAGNOSIS — E875 Hyperkalemia: Secondary | ICD-10-CM | POA: Diagnosis present

## 2022-12-16 DIAGNOSIS — E874 Mixed disorder of acid-base balance: Secondary | ICD-10-CM | POA: Diagnosis present

## 2022-12-16 DIAGNOSIS — R739 Hyperglycemia, unspecified: Secondary | ICD-10-CM | POA: Diagnosis present

## 2022-12-16 DIAGNOSIS — Z515 Encounter for palliative care: Secondary | ICD-10-CM | POA: Diagnosis not present

## 2022-12-16 DIAGNOSIS — R000 Tachycardia, unspecified: Secondary | ICD-10-CM | POA: Diagnosis present

## 2022-12-16 DIAGNOSIS — R680 Hypothermia, not associated with low environmental temperature: Secondary | ICD-10-CM | POA: Diagnosis present

## 2022-12-16 DIAGNOSIS — I2510 Atherosclerotic heart disease of native coronary artery without angina pectoris: Secondary | ICD-10-CM | POA: Diagnosis present

## 2022-12-16 LAB — CBC
HCT: 41 % (ref 36.0–46.0)
HCT: 39.9 % (ref 36.0–46.0)
Hemoglobin: 12.7 g/dL (ref 12.0–15.0)
Hemoglobin: 12.3 g/dL (ref 12.0–15.0)
MCH: 32.1 pg (ref 26.0–34.0)
MCH: 31.1 pg (ref 26.0–34.0)
MCHC: 31 g/dL (ref 30.0–36.0)
MCHC: 30.8 g/dL (ref 30.0–36.0)
MCV: 103.5 fL — ABNORMAL HIGH (ref 80.0–100.0)
MCV: 100.8 fL — ABNORMAL HIGH (ref 80.0–100.0)
Platelets: 247 10*3/uL (ref 150–400)
Platelets: 240 10*3/uL (ref 150–400)
RBC: 3.96 MIL/uL (ref 3.87–5.11)
RBC: 3.96 MIL/uL (ref 3.87–5.11)
RDW: 14.1 % (ref 11.5–15.5)
RDW: 14.1 % (ref 11.5–15.5)
WBC: 11.1 10*3/uL — ABNORMAL HIGH (ref 4.0–10.5)
WBC: 22.4 10*3/uL — ABNORMAL HIGH (ref 4.0–10.5)
nRBC: 0.2 % (ref 0.0–0.2)
nRBC: 0 % (ref 0.0–0.2)

## 2022-12-16 LAB — HIV ANTIBODY (ROUTINE TESTING W REFLEX): HIV Screen 4th Generation wRfx: NONREACTIVE

## 2022-12-16 LAB — RAPID URINE DRUG SCREEN, HOSP PERFORMED
Amphetamines: NOT DETECTED
Barbiturates: NOT DETECTED
Benzodiazepines: NOT DETECTED
Cocaine: NOT DETECTED
Opiates: POSITIVE — AB
Tetrahydrocannabinol: NOT DETECTED

## 2022-12-16 LAB — URINALYSIS, ROUTINE W REFLEX MICROSCOPIC
Bilirubin Urine: NEGATIVE
Glucose, UA: 150 mg/dL — AB
Ketones, ur: NEGATIVE mg/dL
Leukocytes,Ua: NEGATIVE
Nitrite: NEGATIVE
Protein, ur: 100 mg/dL — AB
Specific Gravity, Urine: 1.015 (ref 1.005–1.030)
pH: 5 (ref 5.0–8.0)

## 2022-12-16 LAB — HEMOGLOBIN AND HEMATOCRIT, BLOOD
HCT: 42.2 % (ref 36.0–46.0)
Hemoglobin: 13.4 g/dL (ref 12.0–15.0)

## 2022-12-16 LAB — HEPATIC FUNCTION PANEL
ALT: 1114 U/L — ABNORMAL HIGH (ref 0–44)
AST: 1804 U/L — ABNORMAL HIGH (ref 15–41)
Albumin: 2.9 g/dL — ABNORMAL LOW (ref 3.5–5.0)
Alkaline Phosphatase: 104 U/L (ref 38–126)
Bilirubin, Direct: 0.2 mg/dL (ref 0.0–0.2)
Indirect Bilirubin: 0.7 mg/dL (ref 0.3–0.9)
Total Bilirubin: 0.9 mg/dL (ref 0.3–1.2)
Total Protein: 6.1 g/dL — ABNORMAL LOW (ref 6.5–8.1)

## 2022-12-16 LAB — I-STAT ARTERIAL BLOOD GAS, ED
Acid-base deficit: 14 mmol/L — ABNORMAL HIGH (ref 0.0–2.0)
Bicarbonate: 14.3 mmol/L — ABNORMAL LOW (ref 20.0–28.0)
Calcium, Ion: 1.08 mmol/L — ABNORMAL LOW (ref 1.15–1.40)
HCT: 40 % (ref 36.0–46.0)
Hemoglobin: 13.6 g/dL (ref 12.0–15.0)
O2 Saturation: 100 %
Potassium: 4.8 mmol/L (ref 3.5–5.1)
Sodium: 140 mmol/L (ref 135–145)
TCO2: 16 mmol/L — ABNORMAL LOW (ref 22–32)
pCO2 arterial: 43.4 mmHg (ref 32–48)
pH, Arterial: 7.128 — CL (ref 7.35–7.45)
pO2, Arterial: 434 mmHg — ABNORMAL HIGH (ref 83–108)

## 2022-12-16 LAB — BASIC METABOLIC PANEL
Anion gap: 18 — ABNORMAL HIGH (ref 5–15)
Anion gap: 9 (ref 5–15)
BUN: 18 mg/dL (ref 6–20)
BUN: 23 mg/dL — ABNORMAL HIGH (ref 6–20)
CO2: 14 mmol/L — ABNORMAL LOW (ref 22–32)
CO2: 27 mmol/L (ref 22–32)
Calcium: 7.4 mg/dL — ABNORMAL LOW (ref 8.9–10.3)
Calcium: 8.4 mg/dL — ABNORMAL LOW (ref 8.9–10.3)
Chloride: 107 mmol/L (ref 98–111)
Chloride: 104 mmol/L (ref 98–111)
Creatinine, Ser: 1.41 mg/dL — ABNORMAL HIGH (ref 0.44–1.00)
Creatinine, Ser: 1.78 mg/dL — ABNORMAL HIGH (ref 0.44–1.00)
GFR, Estimated: 44 mL/min — ABNORMAL LOW (ref >60)
GFR, Estimated: 33 mL/min — ABNORMAL LOW (ref >60)
Glucose, Bld: 241 mg/dL — ABNORMAL HIGH (ref 70–99)
Glucose, Bld: 158 mg/dL — ABNORMAL HIGH (ref 70–99)
Potassium: 4.5 mmol/L (ref 3.5–5.1)
Potassium: 3.4 mmol/L — ABNORMAL LOW (ref 3.5–5.1)
Sodium: 139 mmol/L (ref 135–145)
Sodium: 140 mmol/L (ref 135–145)

## 2022-12-16 LAB — POCT I-STAT 7, (LYTES, BLD GAS, ICA,H+H)
Acid-base deficit: 14 mmol/L — ABNORMAL HIGH (ref 0.0–2.0)
Bicarbonate: 13.1 mmol/L — ABNORMAL LOW (ref 20.0–28.0)
Calcium, Ion: 1.07 mmol/L — ABNORMAL LOW (ref 1.15–1.40)
HCT: 42 % (ref 36.0–46.0)
Hemoglobin: 14.3 g/dL (ref 12.0–15.0)
O2 Saturation: 100 %
Patient temperature: 35.2
Potassium: 3.7 mmol/L (ref 3.5–5.1)
Sodium: 139 mmol/L (ref 135–145)
TCO2: 14 mmol/L — ABNORMAL LOW (ref 22–32)
pCO2 arterial: 32.9 mmHg (ref 32–48)
pH, Arterial: 7.2 — ABNORMAL LOW (ref 7.35–7.45)
pO2, Arterial: 369 mmHg — ABNORMAL HIGH (ref 83–108)

## 2022-12-16 LAB — TYPE AND SCREEN
ABO/RH(D): O POS
Antibody Screen: NEGATIVE

## 2022-12-16 LAB — GLUCOSE, CAPILLARY
Glucose-Capillary: 131 mg/dL — ABNORMAL HIGH (ref 70–99)
Glucose-Capillary: 137 mg/dL — ABNORMAL HIGH (ref 70–99)
Glucose-Capillary: 148 mg/dL — ABNORMAL HIGH (ref 70–99)
Glucose-Capillary: 206 mg/dL — ABNORMAL HIGH (ref 70–99)
Glucose-Capillary: 250 mg/dL — ABNORMAL HIGH (ref 70–99)

## 2022-12-16 LAB — HEMOGLOBIN A1C
Hgb A1c MFr Bld: 5.8 % — ABNORMAL HIGH (ref 4.8–5.6)
Mean Plasma Glucose: 119.76 mg/dL

## 2022-12-16 LAB — PROCALCITONIN: Procalcitonin: 3.67 ng/mL

## 2022-12-16 LAB — LACTIC ACID, PLASMA
Lactic Acid, Venous: >9 mmol/L (ref 0.5–1.9)
Lactic Acid, Venous: >9 mmol/L (ref 0.5–1.9)
Lactic Acid, Venous: >9 mmol/L (ref 0.5–1.9)
Lactic Acid, Venous: 7.2 mmol/L (ref 0.5–1.9)

## 2022-12-16 LAB — TROPONIN I (HIGH SENSITIVITY)
Troponin I (High Sensitivity): 47 ng/L — ABNORMAL HIGH (ref <18)
Troponin I (High Sensitivity): 296 ng/L (ref <18)

## 2022-12-16 LAB — CREATININE, SERUM
Creatinine, Ser: 1.47 mg/dL — ABNORMAL HIGH (ref 0.44–1.00)
GFR, Estimated: 42 mL/min — ABNORMAL LOW (ref >60)

## 2022-12-16 LAB — ABO/RH: ABO/RH(D): O POS

## 2022-12-16 LAB — MAGNESIUM: Magnesium: 1.8 mg/dL (ref 1.7–2.4)

## 2022-12-16 LAB — ECHOCARDIOGRAM COMPLETE: Height: 68 in

## 2022-12-16 LAB — PHOSPHORUS: Phosphorus: 7.8 mg/dL — ABNORMAL HIGH (ref 2.5–4.6)

## 2022-12-16 MED ORDER — INSULIN ASPART 100 UNIT/ML IJ SOLN
0.0000 [IU] | INTRAMUSCULAR | Status: DC
  Administered 2022-12-16 (×3): 2 [IU] via SUBCUTANEOUS
  Administered 2022-12-16: 5 [IU] via SUBCUTANEOUS

## 2022-12-16 MED ORDER — ORAL CARE MOUTH RINSE
15.0000 mL | OROMUCOSAL | Status: DC
  Administered 2022-12-16 (×9): 15 mL via OROMUCOSAL

## 2022-12-16 MED ORDER — PANTOPRAZOLE SODIUM 40 MG IV SOLR
40.0000 mg | Freq: Two times a day (BID) | INTRAVENOUS | Status: DC
  Administered 2022-12-16 (×2): 40 mg via INTRAVENOUS
  Filled 2022-12-16 (×2): qty 10

## 2022-12-16 MED ORDER — LACTATED RINGERS IV BOLUS
500.0000 mL | Freq: Once | INTRAVENOUS | Status: AC
  Administered 2022-12-16: 500 mL via INTRAVENOUS

## 2022-12-16 MED ORDER — SODIUM CHLORIDE 0.9 % IV SOLN
250.0000 mL | INTRAVENOUS | Status: DC
  Administered 2022-12-16: 250 mL via INTRAVENOUS

## 2022-12-16 MED ORDER — CHLORHEXIDINE GLUCONATE CLOTH 2 % EX PADS
6.0000 | MEDICATED_PAD | Freq: Every day | CUTANEOUS | Status: DC
  Administered 2022-12-16 (×2): 6 via TOPICAL

## 2022-12-16 MED ORDER — CALCIUM GLUCONATE-NACL 2-0.675 GM/100ML-% IV SOLN
2.0000 g | Freq: Once | INTRAVENOUS | Status: AC
  Administered 2022-12-16: 2000 mg via INTRAVENOUS
  Filled 2022-12-16: qty 100

## 2022-12-16 MED ORDER — LABETALOL HCL 5 MG/ML IV SOLN
10.0000 mg | Freq: Once | INTRAVENOUS | Status: AC
  Administered 2022-12-16: 10 mg via INTRAVENOUS
  Filled 2022-12-16: qty 4

## 2022-12-16 MED ORDER — HEPARIN SODIUM (PORCINE) 5000 UNIT/ML IJ SOLN
5000.0000 [IU] | Freq: Three times a day (TID) | INTRAMUSCULAR | Status: DC
  Administered 2022-12-16 (×4): 5000 [IU] via SUBCUTANEOUS
  Filled 2022-12-16 (×3): qty 1

## 2022-12-16 MED ORDER — SODIUM BICARBONATE 8.4 % IV SOLN
50.0000 meq | Freq: Once | INTRAVENOUS | Status: AC
  Administered 2022-12-16: 50 meq via INTRAVENOUS
  Filled 2022-12-16: qty 50

## 2022-12-16 MED ORDER — POTASSIUM CHLORIDE 20 MEQ PO PACK
40.0000 meq | PACK | Freq: Once | ORAL | Status: AC
  Administered 2022-12-16: 40 meq
  Filled 2022-12-16: qty 2

## 2022-12-16 MED ORDER — SODIUM ZIRCONIUM CYCLOSILICATE 5 G PO PACK
5.0000 g | PACK | Freq: Once | ORAL | Status: AC
  Administered 2022-12-16: 5 g via ORAL
  Filled 2022-12-16: qty 1

## 2022-12-16 MED ORDER — ONDANSETRON HCL 4 MG/2ML IJ SOLN: 4.0000 mg | Freq: Four times a day (QID) | INTRAMUSCULAR | Status: DC | PRN

## 2022-12-16 MED ORDER — DOCUSATE SODIUM 50 MG/5ML PO LIQD: 100.0000 mg | Freq: Two times a day (BID) | ORAL | Status: DC | PRN

## 2022-12-16 MED ORDER — VASOPRESSIN 20 UNITS/100 ML INFUSION FOR SHOCK
0.0300 [IU]/min | INTRAVENOUS | Status: DC
  Filled 2022-12-16 (×2): qty 100

## 2022-12-16 MED ORDER — POLYETHYLENE GLYCOL 3350 17 G PO PACK: 17.0000 g | PACK | Freq: Every day | ORAL | Status: DC | PRN

## 2022-12-16 MED ORDER — IPRATROPIUM-ALBUTEROL 0.5-2.5 (3) MG/3ML IN SOLN
3.0000 mL | RESPIRATORY_TRACT | Status: DC
  Administered 2022-12-16 (×5): 3 mL via RESPIRATORY_TRACT
  Filled 2022-12-16 (×5): qty 3

## 2022-12-16 MED ORDER — SODIUM BICARBONATE 8.4 % IV SOLN
INTRAVENOUS | Status: DC
  Filled 2022-12-16 (×2): qty 1000

## 2022-12-16 MED ORDER — ORAL CARE MOUTH RINSE: 15.0000 mL | OROMUCOSAL | Status: DC | PRN

## 2022-12-16 MED ORDER — FAMOTIDINE 20 MG PO TABS
20.0000 mg | ORAL_TABLET | Freq: Two times a day (BID) | ORAL | Status: DC
  Administered 2022-12-16: 20 mg
  Filled 2022-12-16 (×2): qty 1

## 2022-12-16 MED ORDER — MAGNESIUM SULFATE 2 GM/50ML IV SOLN
2.0000 g | Freq: Once | INTRAVENOUS | Status: AC
  Administered 2022-12-16: 2 g via INTRAVENOUS
  Filled 2022-12-16: qty 50

## 2022-12-16 MED ORDER — FAMOTIDINE 20 MG PO TABS: 20.0000 mg | ORAL_TABLET | Freq: Two times a day (BID) | ORAL | Status: DC

## 2022-12-16 MED ORDER — ACETAMINOPHEN 325 MG PO TABS: 650.0000 mg | ORAL_TABLET | ORAL | Status: DC | PRN

## 2022-12-16 NOTE — Progress Notes (Signed)
eLink Physician-Brief Progress Note Patient Name: Breanna Khan DOB: 07-16-1965 MRN: [id number]   Date of Service  12/16/2022  HPI/Events of Note  Temp 34 degrees. ABG results reviewed.  eICU Interventions  Bair Hugger ordered with target temp of 37 degrees, Calcium gluconate 2 gm iv x 1,  FiO2 reduced to 50 %, Sodium bicarbonate 50 meq iv x 1 ordered.        Thomasene Lot Vershawn Westrup 12/16/2022, 5:39 AM

## 2022-12-16 NOTE — Progress Notes (Signed)
EEG complete - results pending 

## 2022-12-16 NOTE — Procedures (Signed)
Patient Name: Breanna Khan  MRN: [id number]  Epilepsy Attending: Charlsie Quest  Referring Physician/Provider: Norton Blizzard, NP  Date: 12/16/2022  Duration: 22.55 mins  Patient history: 57yo F s/p cardiac arrest. EEG to evaluate for seizure.  Level of alertness: comatose  AEDs during EEG study: None  Technical aspects: This EEG study was done with scalp electrodes positioned according to the 10-20 International system of electrode placement. Electrical activity was reviewed with band pass filter of 1-70Hz , sensitivity of 7 uV/mm, display speed of 67mm/sec with a 60Hz  notched filter applied as appropriate. EEG data were recorded continuously and digitally stored.  Video monitoring was available and reviewed as appropriate.  Description: EEG showed continuous generalized background suppression, not reactive to stimulation. Hyperventilation and photic stimulation were not performed.     ABNORMALITY - Background suppression, generalized  IMPRESSION: This study is suggestive of profound diffuse encephalopathy, nonspecific etiology. No seizures or epileptiform discharges were seen throughout the recording.  Breanna Khan

## 2022-12-16 NOTE — Progress Notes (Addendum)
eLink Physician-Brief Progress Note Patient Name: Shasmeen Loeffel DOB: [birth year] MRN: [id number]   Date of Service  12/16/2022  HPI/Events of Note  Patient with hypotension, tachycardia and oliguria.  eICU Interventions  LR 1000 ml iv fluid bolus ordered.        Thomasene Lot Braxon Suder 12/16/2022, 11:42 PM

## 2022-12-16 NOTE — TOC CM/SW Note (Signed)
TOC consult requesting primary contact/POA information: apparently family was here this  morning and went home to rest.  Per CCM NP, TOC may disregard consult.   Quintella Baton, RN, BSN  Trauma/Neuro ICU Case Manager [phone]

## 2022-12-16 NOTE — ED Provider Notes (Signed)
Patient evaluated by the ICU.  Requesting central line for access given multiple pressors.  Patient was a post CPR who had multiple rounds of CPR prior to ROSC.  Physical Exam  BP (!) 105/58   Pulse 78   Temp (!) 95.2 F (35.1 C) (Bladder)   Resp (!) 26   SpO2 100%   Physical Exam Intubated, unresponsive [place] ill-appearing Procedures  .Central Line  Date/Time: 12/16/2022 12:48 AM  Performed by: Shon Baton, MD Authorized by: Shon Baton, MD   Consent:    Consent obtained:  Emergent situation   Risks, benefits, and alternatives were discussed: yes   Universal protocol:    Immediately prior to procedure, a time out was called: yes   Pre-procedure details:    Indication(s): central venous access and insufficient peripheral access     Hand hygiene: Hand hygiene performed prior to insertion     Sterile barrier technique: All elements of maximal sterile technique followed     Skin preparation:  Chlorhexidine   Skin preparation agent: Skin preparation agent completely dried prior to procedure   Sedation:    Sedation type:  None Anesthesia:    Anesthesia method:  None Procedure details:    Location:  R femoral   Site selection rationale:  Emergent   Patient position:  Supine   Procedural supplies:  Triple lumen   Landmarks identified: yes     Ultrasound guidance: no     Number of attempts:  1   Successful placement: yes   Post-procedure details:    Post-procedure:  Dressing applied   Assessment:  Blood return through all ports   Procedure completion:  Tolerated   ED Course / MDM   Clinical Course as of 12/16/22 0047  Wed Dec 15, 2022  2324 ABG shows resp acidosis [JK]  2328 Bicarb ordered [JK]  2339 Heart rate in the 70s now.  Maps greater than 70.  [JK]  2340 CBC(!) CBC normal [JK]    Clinical Course User Index [JK] Linwood Dibbles, MD   Medical Decision Making Amount and/or Complexity of Data Reviewed Labs: ordered. Decision-making details  documented in ED Course. Radiology: ordered.  Risk Prescription drug management. Decision regarding hospitalization.   Problem List Items Addressed This Visit       Cardiovascular and Mediastinum   * (Principal) Cardiac arrest (HCC) - Primary   Relevant Medications   norepinephrine (LEVOPHED) 4mg  in (0.016 mg/mL) premix infusion   EPINEPHrine (ADRENALIN) 5 mg in NS 250 mL (0.02 mg/mL) premix infusion   heparin injection 5,000 Units   Other Visit Diagnoses     Respiratory acidosis                 Shon Baton, MD 12/16/22 [phone]

## 2022-12-16 NOTE — Consult Note (Signed)
Consultation Note Date: 12/16/2022   Patient Name: Breanna Khan  DOB: December 13, 1965  MRN: [id number]  Age / Sex: 57 y.o., female  PCP: Corky Crafts, MD Referring Physician: Josephine Igo, DO  Reason for Consultation: Establishing goals of care  HPI/Patient Profile: 57 y.o. female  with past medical history of coronary artery disease, tobacco abuse has a history of cardiac stents, admitted on 12/15/2022 with asystolic arrest.   Patient found by EMS in asystole. She had been down for approximately 20 minutes before EMS arrived. She was given 6 rounds of epi. She had 3 separate cardiac arrest events by EMS and 1 CPR episode in the [facility], ER. MRI shows findings consistent with severe global anoxic injury with near complete effacement of the extra-axial CSF spaces and inferior cerebellar tonsillar herniation.  PMT has been consulted to assist with goals of care conversation.  Clinical Assessment and Goals of Care:  I have reviewed medical records including EPIC notes, labs and imaging, received report from RN, assessed the patient and then met at the bedside with patient's sister and family friend to discuss diagnosis prognosis, GOC, EOL wishes, disposition and options. I also called patient's daughter/NOK Shanta.  I introduced Palliative Medicine as specialized medical care for people living with serious illness. It focuses on providing relief from the symptoms and stress of a serious illness. The goal is to improve quality of life for both the patient and the family.  We discussed a brief life review of the patient and then focused on their current illness.   I attempted to elicit values and goals of care important to the patient.    Medical History Review and Understanding:  Provided family with review of acute illness and findings including MRI results with severe anoxic brain injury. Reviewed  devastating prognosis and family is shocked, tearful.  Social History: Patient lost her son 4 years ago and has 1 daughter. She has also recently lost her 2 brothers. She lives with her boyfriend who has mobility problems.  Code Status: Recommended consideration of DNR status, understanding evidenced-based poor outcomes in similar hospitalized patients, as the cause of the arrest is likely associated with chronic/terminal disease rather than a reversible acute cardio-pulmonary event.   Discussion: Emotional support and therapeutic listening was provided as sister shares the unfortunate deaths they have recently had in the family and how difficult this is. She called their other sister to participate in the conversation as well. They ask good questions such as whether patient is sedated or in a coma due to her illness. They also ask about her chances for recovery. I shared that unfortunately, patient's mental status is due to her severe anoxic brain injury and that this is not a recoverable illness. She is only alive as a result of the life support she is on, as nobody could sustain themselves with the level of brain damage she has sustained. Shared that she will never be herself again and provided recommendations for DNR and comfort focused care. Family verbalized their understanding, while also commenting that God has the final say and they believe in miracles. I excused myself to call patient's daughter and provided updates/support as well. Spoke with daughter briefly by phone and provided updates on the above conversation. She is immediately tearful and states that she is returning to the hospital as soon as possible. There is nothing else she wishes to discuss by phone.    Discussed the importance of continued conversation with family and the medical  providers regarding overall plan of care and treatment options, ensuring decisions are within the context of the patient's values and GOCs.    Questions and concerns were addressed. The family was encouraged to call with questions or concerns.  PMT will continue to support holistically.   SUMMARY OF RECOMMENDATIONS   -Continue full code/full scope for now, patient's daughter is on the way back to the hospital for continued conversations -PMT will continue to follow and support  Prognosis:  dismal  Discharge Planning: Anticipated Hospital Death      Primary Diagnoses: Present on Admission:  Cardiac arrest [facility])   Physical Exam Vitals and nursing note reviewed.  Constitutional:      General: She is not in acute distress.    Appearance: She is ill-appearing.     Interventions: She is intubated.  Pulmonary:     Effort: She is intubated.  Neurological:     Mental Status: She is unresponsive.     Vital Signs: BP (!) 142/98   Pulse (!) 103   Temp (!) 97.3 F (36.3 C)   Resp (!) 26   Ht 5\' 8"  (1.727 m)   Wt 101.8 kg   SpO2 100%   BMI 34.12 kg/m  Pain Scale: CPOT     SpO2: SpO2: 100 % O2 Device:SpO2: 100 % O2 Flow Rate: .    Palliative Assessment/Data: 10%     MDM: High    Taryn Nave Jeni Salles, PA-C  Palliative Medicine Team Team phone # [phone]  Thank you for allowing the Palliative Medicine Team to assist in the care of this patient. Please utilize secure chat with additional questions, if there is no response within 30 minutes please call the above phone number.  Palliative Medicine Team providers are available by phone from 7am to 7pm daily and can be reached through the team cell phone.  Should this patient require assistance outside of these hours, please call the patient's attending physician.

## 2022-12-16 NOTE — Progress Notes (Addendum)
Tranported patient on ventilator to MRI & Back without complications RN and RT x2 @ bedside.

## 2022-12-16 NOTE — Progress Notes (Signed)
NAME:  Breanna Khan, MRN:  [id number], DOB:  [birth year], LOS: 0 ADMISSION DATE:  12/15/2022, CONSULTATION DATE: 12/16/2022 REFERRING MD: Dr. Lynelle Doctor, CHIEF COMPLAINT: Cardiac arrest  History of Present Illness:  This is a 57 year old female, past medical history of coronary artery disease, tobacco abuse has a history of cardiac stents.  Was up with family speaking about 30 minutes prior to her arrest.  She became unresponsive.  There was no bystander CPR.  Patient found by EMS in asystole.  She had been down for approximately 20 minutes before EMS arrived.  She was given 6 rounds of epi.  She had 3 separate cardiac arrest events by EMS and 1 CPR episode in the [facility], ER.  She is now hypothermic in shock on 3 vasopressors intubated on mechanical life support.  Pulmonary critical care was consulted for recommendations and management and admission to the intensive care unit.  Pertinent  Medical History  Tobacco abuse, CAD w/ prior stents  Significant Hospital Events: Including procedures, antibiotic start and stop dates in addition to other pertinent events   7/3 admitted   Interim History / Subjective:  Remains on bairhugger, weaning 3 pressors, not requiring sedation Blood tinged output from OGT/ FMS Daughter at bedside  Objective   Blood pressure (!) 133/91, pulse 86, temperature (!) 94.6 F (34.8 C), resp. rate (!) 26, height 5\' 8"  (1.727 m), weight 101.8 kg, SpO2 95 %.    Vent Mode: PRVC FiO2 (%):  [50 %-100 %] 50 % Set Rate:  [15 bmp-26 bmp] 26 bmp Vt Set:  [500 mL-530 mL] 500 mL PEEP:  [5 cmH20] 5 cmH20 Plateau Pressure:  [21 cmH20-25 cmH20] 21 cmH20   Intake/Output Summary (Last 24 hours) at 12/16/2022 4098 Last data filed at 12/16/2022 0600 Gross per 24 hour  Intake 2824.02 ml  Output 680 ml  Net 2144.02 ml   Filed Weights   12/16/22 0600  Weight: 101.8 kg    Examination: General:  critically ill older female lying in bed in NAD on bairhugger HEENT: MM pink/moist,  edentulous, ETT/ OGT (trace bloody residual in OGT), pupils dilated/ unresponsive, absent corneals, trace scleral edema Neuro: unresponsive to noxious stimuli CV: rr, NSR, occ PVC, no murmur, femoral CVL PULM:  MV supported breaths, scattered exp wheeze bilateral, no secretions, no cough/ gag GI: soft, bs+, foley, FMS- some bloody with liquid brown stool   Extremities: warm/dry, no LE edema, R IO Skin: no rashes  Labs reviewed > CBG's> 200, improving ABG, K 4.5, sCr 1.41, WBC 22.4, Hgb stable  Resolved Hospital Problem list     Assessment & Plan:   Out-of-hospital cardiac arrest Asystole on presentation, prolonged downtime Shock- undifferentiated Hypothermia, postarrest History of coronary disease Acute metabolic encephalopathy secondary to above, concern for anoxic brain injury due to prolonged downtime and arrest. Hyperkalemia, elevated serum creatinine, unknown baseline possible AKI. Severe metabolic and respiratory acidosis. ?GIB vs ischemic gut Hyperglycemia  Plan: - neuro exam remains poor despite continued hemodynamic support, not requiring sedation, no brain reflexes - ongoing warming measures  - EEG/ MRI brain to further assess poor clinical exam - correct metabolic derangements, continue supportive care - check UDS, repeat LFTs - cont to wean vasopressor support for MAP goal > 65 - cont Bicarb gtt, stop MIVF LR.  Repeat BMET at 4pm - repeat lactic - trend H/H and send T&S, add PPI BID> H/H stable thus far, with high continued lactic and blood tinged OGT/ stool> concern for ischemic bowel given prolonged down  time - if lactic still not improving, able to wean pressors> consider CTA abd/pelvis - pending echo, repeat trop hs - cont heparin gtt for now if H/H remains stable, no significant bleeding > hold on  cardiology consult pending neuro prognostication - cont full MV support, 4-8cc/kg IBW with goal Pplat <30 and DP<15  - VAP prevention protocol/ PPI - PAD protocol  for sedation> not needed  - intermittent CXR/ ABG - wean FiO2 as able for SpO2 >92%  - aggressive pulmonary hygiene - add bronchodilators  - minimal UOP despite sCr 1.61> 1.41, worried for developing ATN - Trend BMP / urinary output - Replace electrolytes as indicated - Avoid nephrotoxic agents, ensure adequate renal perfusion - add SSI/ CBG q4, check Hgb A1c - daughter at bedside reports patient's significant other has immobility issues and was unable to get to her after she stopped responding to check on her.  No known complaints leading up to this, other than chronic complaints of chronic chest pain.  Daughter arrived from [place].  Edson Snowball is NOK, no other alive siblings.  Discussed concerns for severe brain injury given prolonged down time and poor neuro exam and ongoing MODS, shock on several pressors.  DNR recommended but Edson Snowball needs to talk more with her family in [place].  Ongoing support provided.  PMT consult placed   Best Practice (right click and "Reselect all SmartList Selections" daily)   Diet/type: NPO DVT prophylaxis: prophylactic heparin  GI prophylaxis: H2B Lines: Central line Foley:  Yes, and it is still needed Code Status:  full code Last date of multidisciplinary goals of care discussion > see above   Labs   CBC: Recent Labs  Lab 12/15/22 2247 12/15/22 2322 12/16/22 0052 12/16/22 0127 12/16/22 0320 12/16/22 0519  WBC 9.7  --  11.1*  --  22.4*  --   HGB 12.9 11.9* 12.7 13.6 12.3 14.3  HCT 43.5 35.0* 41.0 40.0 39.9 42.0  MCV 107.9*  --  103.5*  --  100.8*  --   PLT 216  --  247  --  240  --     Basic Metabolic Panel: Recent Labs  Lab 12/15/22 2247 12/15/22 2322 12/16/22 0052 12/16/22 0127 12/16/22 0320 12/16/22 0519  NA 139 137  --  140 139 139  K 5.9* 7.1*  --  4.8 4.5 3.7  CL 106  --   --   --  107  --   CO2 16*  --   --   --  14*  --   GLUCOSE 228*  --   --   --  241*  --   BUN 16  --   --   --  18  --   CREATININE 1.61*  --  1.47*  --   1.41*  --   CALCIUM 8.3*  --   --   --  7.4*  --   MG 2.6*  --   --   --  1.8  --   PHOS  --   --   --   --  7.8*  --    GFR: Estimated Creatinine Clearance: 55.6 mL/min (A) (by C-G formula based on SCr of 1.41 mg/dL (H)). Recent Labs  Lab 12/15/22 2247 12/16/22 0052 12/16/22 0320 12/16/22 0623  WBC 9.7 11.1* 22.4*  --   LATICACIDVEN >9.0* >9.0* >9.0* >9.0*    Liver Function Tests: No results for input(s): "AST", "ALT", "ALKPHOS", "BILITOT", "PROT", "ALBUMIN" in the last 168 hours. No results for  input(s): "LIPASE", "AMYLASE" in the last 168 hours. No results for input(s): "AMMONIA" in the last 168 hours.  ABG    Component Value Date/Time   PHART 7.200 (L) 12/16/2022 0519   PCO2ART 32.9 12/16/2022 0519   PO2ART 369 (H) 12/16/2022 0519   HCO3 13.1 (L) 12/16/2022 0519   TCO2 14 (L) 12/16/2022 0519   ACIDBASEDEF 14.0 (H) 12/16/2022 0519   O2SAT 100 12/16/2022 0519     Coagulation Profile: No results for input(s): "INR", "PROTIME" in the last 168 hours.  Cardiac Enzymes: No results for input(s): "CKTOTAL", "CKMB", "CKMBINDEX", "TROPONINI" in the last 168 hours.  HbA1C: No results found for: "HGBA1C"  CBG: No results for input(s): "GLUCAP" in the last 168 hours.  CCT 50 mins   Posey Boyer, MSN, NP, AG-ACNP-BC [facility] 12/16/2022, 7:52 AM  See Amion for pager If no response to pager , please call [phone] until 7pm After 7:00 pm call Elink  [phone]

## 2022-12-16 NOTE — IPAL (Signed)
  Interdisciplinary Goals of Care Family Meeting   Date carried out: 12/16/2022  Location of the meeting: Conference room  Member's involved: Nurse Practitioner, Bedside Registered Nurse, and Family Member or next of kin  Durable Power of Attorney or acting medical decision maker: daughter,  Breanna Khan  Discussion: We discussed goals of care for Breanna Khan .  Pt's daughter, significant other and his daughter present.  Updated on progressive MODS and MRI brain findings consistent with severe anoxic brain injury and concern for brain death.  Daughter feels like she needs confirmatory apnea testing tomorrow, however in the interim, if patient were to decompensate> family would not want to restart vasopressor support and would proceed with transition to comfort measures/ one-way extubation.  Otherwise plan for apnea testing in am.  BP has become more liable this afternoon, neuro mediated likely.  Family aware she may be hemodynamically unable to tolerate apnea testing in am.    Code status:   Code Status: DNR> no vasopressor support  Disposition: Continue current acute care  Time spent for the meeting: 15 mins       Posey Boyer, MSN, NP, AG-ACNP-BC  [facility] 12/16/2022, 7:14 PM  See Amion for pager If no response to pager , please call [phone] until 7pm After 7:00 pm call Elink  [phone]

## 2022-12-16 NOTE — Progress Notes (Signed)
Discussed with patient's daughter Burtis Junes at bedside the option to bathe vs not bathe and reposition vs. not reposition patient tonight (given goals of care meeting and plan for apnea testing in the morning). Shanta wished to not have patient bathed/cleaned/repositioned tonight unless visibly soiled or uncomfortable. RN will spot-wipe patient down and change gown as needed if sweaty.

## 2022-12-16 NOTE — Progress Notes (Signed)
CARDIOLOGY CONSULT NOTE  Patient ID: Breanna Khan MRN: [id number] DOB/AGE: 09/25/1965 57 y.o.  Admit date: 12/15/2022 Attending physician: Josephine Igo, DO Primary Physician:  NA Outpatient Cardiology Provider: NA  Reason of consultation: s/p cardiac arrest  Referring physician: Dr. Jarold Song  Chief complaint: cardiac arrest  HPI:  Breanna Khan is a 57 y.o. African-American female who presents with a chief complaint of "cardiac arrest ."   Patient is currently intubated in ICU.  No family present at bedside.  HPI obtained as part of discussion with other medical providers and review of EMR.  Per EMR history of coronary artery disease status post intervention in the past, tobacco abuse.  Patient presented to the hospital 12/15/2022 after cardiac arrest.  No CPR was initiated by bystanders.  EMS found her in asystole, per documentation expected downtime prior to EMS arrival is 20 minutes.  After EMS arrival she had 3 separate cardiac arrest and an additional 1 while in [facility] emergency room department.  She was hypothermic and on multiple pressor support.  My partner, Dr. Truett Mainland, was called by the ER physician to review the EKGs.  Patient did not have an acute coronary syndrome but did illustrate ST depressions suggesting endomyocardial ischemia in the setting of recurrent cardiac arrest, acid-base disorders, and hypoxia.  Patient was not taken to the Cath Lab urgently.  But willing to do so either if her clinical course changes or  based on her neuro prognostication s/p arrest.   Saw the patient on morning rounds.  Initially on multiple pressor support now down to vasopressin  Still intubated.  Presented with multiple electrolyte abnormalities as well as acid-base disturbances.  She was initially hypothermic-now normothermic.  Sinus tachycardia on telemetry without any significant arrhythmias.  EEG performed earlier this morning.  Lactic acid remains  elevated.  ALLERGIES: No Known Allergies -Per EMR  PAST MEDICAL HISTORY: See HPI  PAST SURGICAL HISTORY: Unknown.  Status postcardiac arrest.  Intubated.  FAMILY HISTORY: Unknown.  Status postcardiac arrest.  Intubated.  SOCIAL HISTORY:  Unknown.  Status postcardiac arrest.  Intubated.  MEDICATIONS: Current Outpatient Medications  Medication Instructions   carvedilol (COREG) 25 mg, Oral, 2 times daily   diclofenac Sodium (VOLTAREN) 2 g, Topical, 4 times daily   olmesartan (BENICAR) 40 mg, Oral, Daily   olmesartan-hydrochlorothiazide (BENICAR HCT) 40-25 MG tablet 1 tablet, Oral, Daily   oxyCODONE-acetaminophen (PERCOCET) 7.5-325 MG tablet 1 tablet, Oral, Every 6 hours PRN   rosuvastatin (CRESTOR) 10 mg, Oral, Daily at bedtime    REVIEW OF SYSTEMS: Review of Systems  Unable to perform ROS: Intubated    PHYSICAL EXAMINATION: PHYSICAL EXAM: Temp:  [89.2 F (31.8 C)-97.3 F (36.3 C)] 97.3 F (36.3 C) (07/04 0924) Pulse Rate:  [25-103] 103 (07/04 0924) Resp:  [12-27] 26 (07/04 0924) BP: (103-160)/(58-104) 142/98 (07/04 0924) SpO2:  [75 %-100 %] 95 % (07/04 0924) FiO2 (%):  [50 %-100 %] 50 % (07/04 0924) Weight:  [101.8 kg] 101.8 kg (07/04 0600)  Intake/Output:  Intake/Output Summary (Last 24 hours) at 12/16/2022 1319 Last data filed at 12/16/2022 1123 Gross per 24 hour  Intake 4508.1 ml  Output 2240 ml  Net 2268.1 ml     Net IO Since Admission: 2,268.1 mL [12/16/22 1319]  Weights:     12/16/2022    6:00 AM  Last 3 Weights  Weight (lbs) 224 lb 6.9 oz  Weight (kg) 101.8 kg    PHYSICAL EXAM:    12/16/2022    9:24 AM  12/16/2022    7:30 AM 12/16/2022    7:15 AM  Vitals with BMI  Systolic 142 133 161  Diastolic 98 91 95  Pulse 103 86 85    CONSTITUTIONAL: Appears older than stated age, hemodynamically stable, no acute distress SKIN: Skin is warm and dry. No rash noted. No cyanosis. No pallor. No jaundice HEAD: Normocephalic and atraumatic.  EYES: No scleral  icterus MOUTH/THROAT: ET and OG tube in place NECK: No JVD present. No thyromegaly noted. No carotid bruits  CHEST Normal respiratory effort. No intercostal retractions  LUNGS: Mechanical breath sounds bilaterally. CARDIOVASCULAR: Tachycardic, positive S1-S2, no murmurs rubs or gallops appreciated secondary to tachycardia. ABDOMINAL: Obese, soft, nontender, nondistended, positive bowel sounds in all 4 quadrants, no apparent ascites.  Rectal tube and Foley tube in place EXTREMITIES: No peripheral edema  HEMATOLOGIC: No significant bruising NEUROLOGIC: No corneal reflex, does not withdraw to painful stimuli PSYCHIATRIC: Intubated   LAB RESULTS: Chemistry Recent Labs  Lab 12/15/22 2247 12/15/22 2322 12/16/22 0052 12/16/22 0127 12/16/22 0320 12/16/22 0519 12/16/22 0909  NA 139   < >  --  140 139 139  --   K 5.9*   < >  --  4.8 4.5 3.7  --   CL 106  --   --   --  107  --   --   CO2 16*  --   --   --  14*  --   --   GLUCOSE 228*  --   --   --  241*  --   --   BUN 16  --   --   --  18  --   --   CREATININE 1.61*  --  1.47*  --  1.41*  --   --   CALCIUM 8.3*  --   --   --  7.4*  --   --   PROT  --   --   --   --   --   --  6.1*  ALBUMIN  --   --   --   --   --   --  2.9*  AST  --   --   --   --   --   --  1,804*  ALT  --   --   --   --   --   --  1,114*  ALKPHOS  --   --   --   --   --   --  104  BILITOT  --   --   --   --   --   --  0.9  GFRNONAA 37*  --  42*  --  44*  --   --   ANIONGAP 17*  --   --   --  18*  --   --    < > = values in this interval not displayed.    Hematology Recent Labs  Lab 12/15/22 2247 12/15/22 2322 12/16/22 0052 12/16/22 0127 12/16/22 0320 12/16/22 0519 12/16/22 0909  WBC 9.7  --  11.1*  --  22.4*  --   --   RBC 4.03  --  3.96  --  3.96  --   --   HGB 12.9   < > 12.7   < > 12.3 14.3 13.4  HCT 43.5   < > 41.0   < > 39.9 42.0 42.2  MCV 107.9*  --  103.5*  --  100.8*  --   --  MCH 32.0  --  32.1  --  31.1  --   --   MCHC 29.7*  --  31.0  --   30.8  --   --   RDW 13.8  --  14.1  --  14.1  --   --   PLT 216  --  247  --  240  --   --    < > = values in this interval not displayed.   High Sensitivity Troponin:   Recent Labs  Lab 12/15/22 2247 12/16/22 0052 12/16/22 0909  TROPONINIHS 11 47* 296*     Cardiac EnzymesNo results for input(s): "TROPONINI" in the last 168 hours. No results for input(s): "TROPIPOC" in the last 168 hours.  BNPNo results for input(s): "BNP", "PROBNP" in the last 168 hours.  DDimer No results for input(s): "DDIMER" in the last 168 hours.  Hemoglobin A1c:  Lab Results  Component Value Date   HGBA1C 5.8 (H) 12/16/2022   MPG 119.76 12/16/2022   TSH No results for input(s): "TSH" in the last 8760 hours. Lipid Panel No results found for: "CHOL", "HDL", "LDLCALC", "LDLDIRECT", "TRIG", "CHOLHDL" Drugs of Abuse     Component Value Date/Time   LABOPIA POSITIVE (A) 12/16/2022 0909   COCAINSCRNUR NONE DETECTED 12/16/2022 0909   LABBENZ NONE DETECTED 12/16/2022 0909   AMPHETMU NONE DETECTED 12/16/2022 0909   THCU NONE DETECTED 12/16/2022 0909   LABBARB NONE DETECTED 12/16/2022 0909      CARDIAC DATABASE: EKG: December 15, 2022: Sinus rhythm, 79 bpm, TWI in high lateral leads consider lateral ischemia, without injury pattern.  December 15, 2022: Sinus rhythm, 61 bpm, diffuse ST depressions, consider global ischemia.  December 16, 2022: Sinus rhythm, 66 bpm, T WI in the high lateral leads consider lateral ischemia.  Compared to prior EKG diffuse ST depressions have improved.  Echocardiogram: Pending.  Scheduled Meds:  Chlorhexidine Gluconate Cloth  6 each Topical Daily   heparin  5,000 Units Subcutaneous Q8H   insulin aspart  0-15 Units Subcutaneous Q4H   ipratropium-albuterol  3 mL Nebulization Q4H   mouth rinse  15 mL Mouth Rinse Q2H   pantoprazole (PROTONIX) IV  40 mg Intravenous Q12H    Continuous Infusions:  sodium chloride 10 mL/hr at 12/16/22 1100   epinephrine Stopped (12/16/22 1045)    norepinephrine (LEVOPHED) Adult infusion Stopped (12/16/22 0847)   sodium bicarbonate 150 mEq in dextrose 5 % 1,150 mL infusion 75 mL/hr at 12/16/22 1123   vasopressin Stopped (12/16/22 1142)    PRN Meds: acetaminophen, docusate, ondansetron (ZOFRAN) IV, mouth rinse, polyethylene glycol  IMPRESSION & RECOMMENDATIONS: Breanna Khan is a 57 y.o. African-American female whose past medical history and cardiovascular risk factors include: History of CAD status post stents, tobacco abuse, obesity.  Impression:  Out-of-hospital cardiac arrest. Asystole on presentation with prolonged downtime. Hypothermia postcardiac arrest. Shock liver Vent dependent respiratory failure. Multiple electrolyte abnormalities and acid-base disorders Leukocytosis  Plan:  Out-of-hospital cardiac arrest Asystole on presentation with prolonged downtime. Prolonged downtime. Resuscitated 3 times while in route by EMS and again while she was in the emergency room department. Initial pH documented as 6.8 Lactic acid remains elevated. Initially on 3 pressors now on vasopressor. Telemetry notes sinus tachycardia without any significant dysrhythmias. One of  initial EKGs noted diffuse ST depressions suggestive of global ischemia likely secondary to multiple resuscitation efforts, acidosis, and hypoxia.  Patient did not rule in for STEMI and therefore was not taken to Cath Lab on presentation. Repeat EKG notes  improvement in ST changes.  EEG was performed earlier this morning. Brain MRI scheduled for later this afternoon. Echocardiogram pending. RN informs me that she remains a full code.  Family arrived in town earlier this morning and will come back later this afternoon. Given her labs, prolonged downtime, lactic acidosis, high probability for anoxic brain injury. Greatly appreciate the care provided by critical care medicine. For now would recommend seeing how she does from a neuro prognostication standpoint prior  to considering/discussing ischemic workup. However, if there is an acute change in clinical status please reach out for re-evaluation / reconsideration otherwise will follow peripherally, please call us sooner if needed.  Shock liver:  Maintain a MAP > 70 mmHG Monitor AST/ALT  Vent dependent respiratory failure: Management per primary team.  Multiple electrolyte abnormalities and acid-base disorders: Management per primary team.  Leukocytosis: Status post blood cultures, results pending, management per primary team.  Patient's questions and concerns were addressed to her satisfaction. She voices understanding of the instructions provided during this encounter.   This note was created using a voice recognition software as a result there may be grammatical errors inadvertently enclosed that do not reflect the nature of this encounter. Every attempt is made to correct such errors.  Breanna Khan [facility]  Pager:  [phone] Office: [phone] 12/16/2022, 1:19 PM

## 2022-12-16 NOTE — H&P (Signed)
NAME:  Breanna Khan, MRN:  [id number], DOB:  1966-05-01, LOS: 0 ADMISSION DATE:  12/15/2022, CONSULTATION DATE: 12/16/2022 REFERRING MD: Dr. Lynelle Doctor, CHIEF COMPLAINT: Cardiac arrest  History of Present Illness:  This is a 57 year old female, past medical history of coronary artery disease, tobacco abuse has a history of cardiac stents.  Was up with family speaking about 30 minutes prior to her arrest.  She became unresponsive.  There was no bystander CPR.  Patient found by EMS in asystole.  She had been down for approximately 20 minutes before EMS arrived.  She was given 6 rounds of epi.  She had 3 separate cardiac arrest events by EMS and 1 CPR episode in the [facility], ER.  She is now hypothermic in shock on 3 vasopressors intubated on mechanical life support.  Pulmonary critical care was consulted for recommendations and management and admission to the intensive care unit.  Pertinent  Medical History  No past medical history on file.   Significant Hospital Events: Including procedures, antibiotic start and stop dates in addition to other pertinent events     Interim History / Subjective:  Per HPI above  Objective   Blood pressure (!) 105/58, pulse 78, temperature (!) 95.2 F (35.1 C), temperature source Bladder, resp. rate (!) 26, SpO2 100 %.    Vent Mode: PRVC FiO2 (%):  [100 %] 100 % Set Rate:  [15 bmp] 15 bmp Vt Set:  [500 mL] 500 mL  No intake or output data in the 24 hours ending 12/16/22 0107 There were no vitals filed for this visit.  Examination: General: Middle-aged female intubated critically ill mechanical life support HENT: NCAT, endotracheal tube in place Lungs: Clear to auscultation, bilateral ventilated breath sounds Cardiovascular: Regular rate rhythm, S1-S2 Abdomen: Soft, nontender nondistended Extremities: No significant edema, right tibial intraosseous catheter Neuro: Alert oriented following commands GU: Deferred  Resolved Hospital Problem list      Assessment & Plan:   Out-of-hospital cardiac arrest Asystole on presentation, prolonged downtime Hypothermia, postarrest History of coronary disease Acute metabolic encephalopathy secondary to above, concern for anoxic brain injury due to prolonged downtime and arrest. Hyperkalemia, elevated serum creatinine, unknown baseline possible AKI. Severe metabolic and respiratory acidosis. Plan: Admit to the intensive care unit Remains on multiple pressors Will need central venous access Continue to titrate to maintain mean arterial pressure greater than 65. Remains on mechanical vent support, adult mechanical vent protocol VAP prophylaxis, wean vent as tolerated, maintain sats above 90% euthermia, prevent fever, patient is already cool. Patient will need 48 to 72 hours for neuro prognostication. I have attempted to call the patient's family.  They were in the consultation room in the ER however they have left now.  And I cannot seem to get a hold of them.  She does remain a full code and they were talked to by the EDP. Repeat abg now after vent changes Chest x-ray, ABG and repeat labs in the morning. Echocardiogram pending Bicarb infusion    Best Practice (right click and "Reselect all SmartList Selections" daily)   Diet/type: NPO DVT prophylaxis: prophylactic heparin  GI prophylaxis: H2B Lines: Central line Foley:  Yes, and it is still needed Code Status:  full code Last date of multidisciplinary goals of care discussion [currently full code, I did attempt to try to contact family again.  They are no longer in the ED consult room.  And they are not answering the phone.]  Labs   CBC: Recent Labs  Lab 12/15/22 2247  12/15/22 2322  WBC 9.7  --   HGB 12.9 11.9*  HCT 43.5 35.0*  MCV 107.9*  --   PLT 216  --     Basic Metabolic Panel: Recent Labs  Lab 12/15/22 2247 12/15/22 2322  NA 139 137  K 5.9* 7.1*  CL 106  --   CO2 16*  --   GLUCOSE 228*  --   BUN 16  --    CREATININE 1.61*  --   CALCIUM 8.3*  --   MG 2.6*  --    GFR: CrCl cannot be calculated (Unknown ideal weight.). Recent Labs  Lab 12/15/22 2247  WBC 9.7  LATICACIDVEN >9.0*    Liver Function Tests: No results for input(s): "AST", "ALT", "ALKPHOS", "BILITOT", "PROT", "ALBUMIN" in the last 168 hours. No results for input(s): "LIPASE", "AMYLASE" in the last 168 hours. No results for input(s): "AMMONIA" in the last 168 hours.  ABG    Component Value Date/Time   PHART 6.882 (LL) 12/15/2022 2322   PCO2ART 88.7 (HH) 12/15/2022 2322   PO2ART 385 (H) 12/15/2022 2322   HCO3 16.7 (L) 12/15/2022 2322   TCO2 19 (L) 12/15/2022 2322   ACIDBASEDEF 17.0 (H) 12/15/2022 2322   O2SAT 100 12/15/2022 2322     Coagulation Profile: No results for input(s): "INR", "PROTIME" in the last 168 hours.  Cardiac Enzymes: No results for input(s): "CKTOTAL", "CKMB", "CKMBINDEX", "TROPONINI" in the last 168 hours.  HbA1C: No results found for: "HGBA1C"  CBG: No results for input(s): "GLUCAP" in the last 168 hours.  Review of Systems:   Critically ill Per HPI above  Past Medical History:  She,  has no past medical history on file.   Surgical History:  None on file and unable to obtain from patient.  Social History:      Family History:  Her family history is not on file.   Allergies No Known Allergies   Home Medications  Prior to Admission medications   Not on File    This patient is critically ill with multiple organ system failure; which, requires frequent high complexity decision making, assessment, support, evaluation, and titration of therapies. This was completed through the application of advanced monitoring technologies and extensive interpretation of multiple databases. During this encounter critical care time was devoted to patient care services described in this note for 32 minutes.  Josephine Igo, DO [facility] 12/16/2022 1:07 AM

## 2022-12-16 NOTE — Sepsis Progress Note (Signed)
Following for sepsis monitoring ?

## 2022-12-16 NOTE — Progress Notes (Signed)
Patient transported from ED to [facility] without incident.

## 2022-12-16 NOTE — Progress Notes (Signed)
Attempted Echocardiogram, patient went to MRI.  

## 2022-12-16 NOTE — Progress Notes (Signed)
eLink Physician-Brief Progress Note Patient Name: Noam Maples DOB: 1966/05/31 MRN: [id number]   Date of Service  12/16/2022  HPI/Events of Note  Patient admitted via ED with out of hospital cardiac arrest with prolonged down time prior to onset of resuscitation, patient is comatose, intubated , sedated, and mechanically ventilated.  eICU Interventions  New Patient Evaluation.        Thomasene Lot Yaacov Koziol 12/16/2022, 5:23 AM

## 2022-12-16 NOTE — Progress Notes (Addendum)
BP stabilized and all pressors have been weaned off. No change in neurological status. Pending MRI later today.

## 2022-12-17 ENCOUNTER — Inpatient Hospital Stay (HOSPITAL_COMMUNITY): Payer: 59

## 2022-12-17 LAB — CULTURE, BLOOD (ROUTINE X 2)
Culture: NO GROWTH
Special Requests: ADEQUATE

## 2022-12-17 MED ORDER — LACTATED RINGERS IV BOLUS
500.0000 mL | Freq: Once | INTRAVENOUS | Status: AC
  Administered 2022-12-17: 500 mL via INTRAVENOUS

## 2022-12-17 MED ORDER — HALOPERIDOL LACTATE 2 MG/ML PO CONC: 0.5000 mg | ORAL | Status: DC | PRN

## 2022-12-17 MED ORDER — BIOTENE DRY MOUTH MT LIQD: 15.0000 mL | OROMUCOSAL | Status: DC | PRN

## 2022-12-17 MED ORDER — SODIUM CHLORIDE 0.9% FLUSH: 3.0000 mL | INTRAVENOUS | Status: DC | PRN

## 2022-12-17 MED ORDER — ACETAMINOPHEN 325 MG PO TABS: 650.0000 mg | ORAL_TABLET | Freq: Four times a day (QID) | ORAL | Status: DC | PRN

## 2022-12-17 MED ORDER — LORAZEPAM 1 MG PO TABS: 1.0000 mg | ORAL_TABLET | ORAL | Status: DC | PRN

## 2022-12-17 MED ORDER — SODIUM CHLORIDE 0.9 % IV SOLN: 250.0000 mL | INTRAVENOUS | Status: DC | PRN

## 2022-12-17 MED ORDER — HALOPERIDOL 0.5 MG PO TABS: 0.5000 mg | ORAL_TABLET | ORAL | Status: DC | PRN

## 2022-12-17 MED ORDER — SODIUM CHLORIDE 0.9% FLUSH: 3.0000 mL | Freq: Two times a day (BID) | INTRAVENOUS | Status: DC

## 2022-12-17 MED ORDER — ONDANSETRON 4 MG PO TBDP: 4.0000 mg | ORAL_TABLET | Freq: Four times a day (QID) | ORAL | Status: DC | PRN

## 2022-12-17 MED ORDER — ACETAMINOPHEN 650 MG RE SUPP: 650.0000 mg | Freq: Four times a day (QID) | RECTAL | Status: DC | PRN

## 2022-12-17 MED ORDER — POLYVINYL ALCOHOL 1.4 % OP SOLN: 1.0000 [drp] | Freq: Four times a day (QID) | OPHTHALMIC | Status: DC | PRN

## 2022-12-17 MED ORDER — HALOPERIDOL LACTATE 5 MG/ML IJ SOLN: 0.5000 mg | INTRAMUSCULAR | Status: DC | PRN

## 2022-12-17 MED ORDER — GLYCOPYRROLATE 0.2 MG/ML IJ SOLN: 0.2000 mg | INTRAMUSCULAR | Status: DC | PRN

## 2022-12-17 MED ORDER — LORAZEPAM 2 MG/ML PO CONC: 1.0000 mg | ORAL | Status: DC | PRN

## 2022-12-17 MED ORDER — ONDANSETRON HCL 4 MG/2ML IJ SOLN: 4.0000 mg | Freq: Four times a day (QID) | INTRAMUSCULAR | Status: DC | PRN

## 2022-12-17 MED ORDER — MORPHINE 100MG IN NS 100ML (1MG/ML) PREMIX INFUSION
1.0000 mg/h | INTRAVENOUS | Status: DC
  Administered 2022-12-17: 1 mg/h via INTRAVENOUS
  Filled 2022-12-17: qty 100

## 2022-12-17 MED ORDER — LORAZEPAM 2 MG/ML IJ SOLN: 1.0000 mg | INTRAMUSCULAR | Status: DC | PRN

## 2022-12-17 MED ORDER — GLYCOPYRROLATE 1 MG PO TABS: 1.0000 mg | ORAL_TABLET | ORAL | Status: DC | PRN

## 2022-12-17 MED ORDER — MORPHINE BOLUS VIA INFUSION: 1.0000 mg | INTRAVENOUS | Status: DC | PRN

## 2022-12-18 LAB — CULTURE, BLOOD (ROUTINE X 2): Special Requests: ADEQUATE

## 2022-12-19 LAB — CULTURE, BLOOD (ROUTINE X 2): Special Requests: ADEQUATE

## 2022-12-20 LAB — CULTURE, BLOOD (ROUTINE X 2): Culture: NO GROWTH

## 2022-12-21 LAB — CULTURE, BLOOD (ROUTINE X 2)
Culture: NO GROWTH
Culture: NO GROWTH

## 2022-12-24 MED FILL — Medication: Qty: 1 | Status: AC

## 2022-12-29 ENCOUNTER — Ambulatory Visit: Payer: 59 | Admitting: Physician Assistant

## 2023-01-13 NOTE — Progress Notes (Signed)
eLink Physician-Brief Progress Note Patient Name: Breanna Khan DOB: [birth year] MRN: [id number]   Date of Service  12/31/2022  HPI/Events of Note  Family was called in due to declining blood pressure, on arrival they requested withdrawal of care. I went into the room and verified their wishes in the presence of the bedside RN, I also explained the process to them.  eICU Interventions  Comfort care + terminal extubation orders entered.        Melisssa Donner U Zareen Jamison 12/14/2022, 1:23 AM

## 2023-01-13 NOTE — Procedures (Signed)
Extubation Procedure Note  Patient Details:   Name: Breanna Khan DOB: [birth year] MRN: [id number]   Airway Documentation:    Vent end date: 12/27/2022 Vent end time: 0146   RT terminally extubated to RA per MD order.   Evaluation  O2 sats: currently acceptable Complications: No apparent complications Patient did not tolerate procedure well. Bilateral Breath Sounds: Rhonchi, Diminished   No  Letta Median 01/02/2023, 146

## 2023-01-13 NOTE — Death Summary Note (Signed)
DEATH SUMMARY   Patient Details  Name: Breanna Khan MRN: [id number] DOB: 1965/09/30  Admission/Discharge Information   Admit Date:  03-Jan-2023  Date of Death: Date of Death: 01/05/23  Time of Death: Time of Death: 0150  Length of Stay: 1  Referring Physician: Corky Crafts, MD   Reason(s) for Hospitalization   This is a 57 year old female, past medical history of coronary artery disease, tobacco abuse has a history of cardiac stents. Was up with family speaking about 30 minutes prior to her arrest. She became unresponsive. There was no bystander CPR. Patient found by EMS in asystole. She had been down for approximately 20 minutes before EMS arrived. She was given 6 rounds of epi. She had 3 separate cardiac arrest events by EMS and 1 CPR episode in the [facility], ER. She is now hypothermic in shock on 3 vasopressors intubated on mechanical life support. Pulmonary critical care was consulted for recommendations and management and admission to the intensive care unit.   Diagnoses  Preliminary cause of death:  Secondary Diagnoses (including complications and co-morbidities):  Principal Problem:   Cardiac arrest [facility]) Active Problems:   Dependent on ventilator (HCC)   Shock liver   Respiratory acidosis   Leukocytosis   Asystole [facility])   Brief Hospital Course (including significant findings, care, treatment, and services provided and events leading to death)   [name] is a 57 year old female, past medical history of coronary artery disease, tobacco abuse has a history of cardiac stents. Was up with family speaking about 30 minutes prior to her arrest. She became unresponsive. There was no bystander CPR. Patient found by EMS in asystole. She had been down for approximately 20 minutes before EMS arrived. She was given 6 rounds of epi. She had 3 separate cardiac arrest events by EMS and 1 CPR episode in the [facility], ER. She is now hypothermic in shock on 3 vasopressors  intubated on mechanical life support. Pulmonary critical care was consulted for recommendations and management and admission to the intensive care unit.   Out-of-hospital cardiac arrest Asystole on presentation, prolonged downtime Hypothermia, postarrest History of coronary disease Acute metabolic encephalopathy secondary to above, concern for anoxic brain injury due to prolonged downtime and arrest. Hyperkalemia, elevated serum creatinine, unknown baseline possible AKI. Severe metabolic and respiratory acidosis.  MRI Brain - was found to have severe anoxic brain injury   Decision was made for comfort care transition after a discussion with [facility] provider.     Pertinent Labs and Studies   Significant Diagnostic Studies MR BRAIN WO CONTRAST  Result Date: 12/16/2022 CLINICAL DATA:  Cardiac arrest. EXAM: MRI HEAD WITHOUT CONTRAST TECHNIQUE: Multiplanar, multiecho pulse sequences of the brain and surrounding structures were obtained without intravenous contrast. COMPARISON:  MRI 03/27/2015 FINDINGS: Brain: There is diffuse diffusion restriction throughout the supratentorial and infratentorial cortex, basal ganglia, and thalami with associated T2/FLAIR hyperintensity and swelling consistent with global anoxic injury. There is diffuse sulcal effacement, effacement of the basal cisterns, and inferior cerebellar tonsillar herniation. The fourth ventricle is effaced. There is no upstream hydrocephalus at this time. Extensive curvilinear SWI signal dropout throughout the brain is favored to reflect slow flow due to cerebral edema. Vascular: The ICA and vertebral artery flow voids are abnormal. Skull and upper cervical spine: Normal marrow signal. Sinuses/Orbits: The paranasal sinuses are clear. The globes are unremarkable. Prominence of the optic nerve sheaths may reflect increased intracranial pressure. Other: The mastoid air cells and middle ear cavities are clear. IMPRESSION: 1.  Findings consistent with  severe global anoxic injury with near complete effacement of the extra-axial CSF spaces and inferior cerebellar tonsillar herniation. 2. Absent intracranial arterial flow voids consistent with significant elevated intracranial pressures. Electronically Signed   By: Lesia Hausen M.D.   On: 12/16/2022 14:04   EEG adult  Result Date: 12/16/2022 Charlsie Quest, MD     12/16/2022 10:49 AM Patient Name: Mardy Hoppe MRN: [id number] Epilepsy Attending: Charlsie Quest Referring Physician/Provider: Norton Blizzard, NP Date: 12/16/2022 Duration: 22.55 mins Patient history: 57yo F s/p cardiac arrest. EEG to evaluate for seizure. Level of alertness: comatose AEDs during EEG study: None Technical aspects: This EEG study was done with scalp electrodes positioned according to the 10-20 International system of electrode placement. Electrical activity was reviewed with band pass filter of 1-70Hz , sensitivity of 7 uV/mm, display speed of 85mm/sec with a 60Hz  notched filter applied as appropriate. EEG data were recorded continuously and digitally stored.  Video monitoring was available and reviewed as appropriate. Description: EEG showed continuous generalized background suppression, not reactive to stimulation. Hyperventilation and photic stimulation were not performed.   ABNORMALITY - Background suppression, generalized IMPRESSION: This study is suggestive of profound diffuse encephalopathy, nonspecific etiology. No seizures or epileptiform discharges were seen throughout the recording. Charlsie Quest   DG Chest 1 View  Result Date: 12/16/2022 CLINICAL DATA:  Ventilator dependence. EXAM: CHEST  1 VIEW COMPARISON:  12/15/2022 FINDINGS: The cardiopericardial silhouette is within normal limits for size. Mild vascular congestion with interstitial and subtle airspace disease in the left mid and lower lung. Endotracheal tube tip is proximally 3.5 cm above the base of the carina. The NG tube passes into the stomach although the  distal tip position is not included on the film. Telemetry leads overlie the chest. IMPRESSION: Mild vascular congestion with interstitial and subtle airspace disease in the left mid and lower lung. Electronically Signed   By: Kennith Center M.D.   On: 12/16/2022 09:44   DG Abdomen 1 View  Result Date: 12/16/2022 CLINICAL DATA:  Check gastric catheter placement EXAM: ABDOMEN - 1 VIEW COMPARISON:  None Available. FINDINGS: Gastric catheter is noted extending into the stomach. No obstructive changes are seen. IMPRESSION: Gastric catheter within the stomach. Electronically Signed   By: Alcide Clever M.D.   On: 12/16/2022 03:29   DG Chest Portable 1 View  Result Date: 12/15/2022 CLINICAL DATA:  CPR EXAM: PORTABLE CHEST 1 VIEW COMPARISON:  None Available. FINDINGS: Endotracheal tube tip is about 4.2 cm superior to the carina. Mild patchy basilar opacities. No pleural effusion. Cardiomediastinal silhouette within normal limits. No pneumothorax. IMPRESSION: 1. Endotracheal tube tip about 4.2 cm superior to the carina. 2. Mild patchy basilar opacities, possible, mild infiltrate, or aspiration Electronically Signed   By: Jasmine Pang M.D.   On: 12/15/2022 22:58    Microbiology Recent Results (from the past 240 hour(s))  Culture, blood (Routine X 2) w Reflex to ID Panel     Status: None   Collection Time: 12/16/22 12:19 AM   Specimen: BLOOD LEFT ARM  Result Value Ref Range Status   Specimen Description BLOOD LEFT ARM  Final   Special Requests   Final    BOTTLES DRAWN AEROBIC AND ANAEROBIC Blood Culture results may not be optimal due to an inadequate volume of blood received in culture bottles   Culture   Final    NO GROWTH 5 DAYS Performed at [facility], [address], [place], [place] [address]  Report Status 12/21/2022 FINAL  Final  Blood culture (routine x 2)     Status: None   Collection Time: 12/16/22 12:37 AM   Specimen: BLOOD RIGHT ARM  Result Value Ref Range Status   Specimen  Description BLOOD RIGHT ARM  Final   Special Requests   Final    BOTTLES DRAWN AEROBIC AND ANAEROBIC Blood Culture adequate volume   Culture   Final    NO GROWTH 5 DAYS Performed at [facility], [address] [place], [place] [address]    Report Status 12/21/2022 FINAL  Final  Blood culture (routine x 2)     Status: None   Collection Time: 12/16/22  6:18 AM   Specimen: BLOOD LEFT HAND  Result Value Ref Range Status   Specimen Description BLOOD LEFT HAND  Final   Special Requests   Final    BOTTLES DRAWN AEROBIC AND ANAEROBIC Blood Culture adequate volume   Culture   Final    NO GROWTH 5 DAYS Performed at [facility], [address], [place], [place] [address]    Report Status 12/21/2022 FINAL  Final  Culture, blood (Routine X 2) w Reflex to ID Panel     Status: None   Collection Time: 12/16/22  6:18 AM   Specimen: BLOOD LEFT WRIST  Result Value Ref Range Status   Specimen Description BLOOD LEFT WRIST  Final   Special Requests   Final    BOTTLES DRAWN AEROBIC AND ANAEROBIC Blood Culture adequate volume   Culture   Final    NO GROWTH 5 DAYS Performed at [facility], [address], [place], [place] [address]    Report Status 12/21/2022 FINAL  Final    Lab Basic Metabolic Panel: Recent Labs  Lab 12/15/22 2247 12/15/22 2322 12/16/22 0052 12/16/22 0127 12/16/22 0320 12/16/22 0519 12/16/22 1545  NA 139 137  --  140 139 139 140  K 5.9* 7.1*  --  4.8 4.5 3.7 3.4*  CL 106  --   --   --  107  --  104  CO2 16*  --   --   --  14*  --  27  GLUCOSE 228*  --   --   --  241*  --  158*  BUN 16  --   --   --  18  --  23*  CREATININE 1.61*  --  1.47*  --  1.41*  --  1.78*  CALCIUM 8.3*  --   --   --  7.4*  --  8.4*  MG 2.6*  --   --   --  1.8  --   --   PHOS  --   --   --   --  7.8*  --   --    Liver Function Tests: Recent Labs  Lab 12/16/22 0909  AST 1,804*  ALT 1,114*  ALKPHOS 104  BILITOT 0.9  PROT 6.1*  ALBUMIN 2.9*   No results for input(s):  "LIPASE", "AMYLASE" in the last 168 hours. No results for input(s): "AMMONIA" in the last 168 hours. CBC: Recent Labs  Lab 12/15/22 2247 12/15/22 2322 12/16/22 0052 12/16/22 0127 12/16/22 0320 12/16/22 0519 12/16/22 0909  WBC 9.7  --  11.1*  --  22.4*  --   --   HGB 12.9   < > 12.7 13.6 12.3 14.3 13.4  HCT 43.5   < > 41.0 40.0 39.9 42.0 42.2  MCV 107.9*  --  103.5*  --  100.8*  --   --   PLT 216  --  247  --  240  --   --    < > = values in this interval not displayed.   Cardiac Enzymes: No results for input(s): "CKTOTAL", "CKMB", "CKMBINDEX", "TROPONINI" in the last 168 hours. Sepsis Labs: Recent Labs  Lab 12/15/22 2247 12/16/22 0052 12/16/22 0320 12/16/22 0623 12/16/22 0756 12/16/22 0909  PROCALCITON  --   --   --   --   --  3.67  WBC 9.7 11.1* 22.4*  --   --   --   LATICACIDVEN >9.0* >9.0* >9.0* >9.0* 7.2*  --     Procedures/Operations  ETT   Elige Radon L Viviene Thurston 12/22/2022, 6:39 PM

## 2023-01-13 DEATH — deceased
